# Patient Record
Sex: Male | Born: 1990 | Race: Black or African American | Hispanic: No | Marital: Single | Smoking: Current every day smoker
Health system: Southern US, Community
[De-identification: ages and names within clinical notes are randomized; demographics above are authoritative.]

## PROBLEM LIST (undated history)

## (undated) ENCOUNTER — Emergency Department (HOSPITAL_COMMUNITY): Admission: EM | Payer: Self-pay | Source: Home / Self Care

## (undated) DIAGNOSIS — F209 Schizophrenia, unspecified: Secondary | ICD-10-CM

## (undated) DIAGNOSIS — F909 Attention-deficit hyperactivity disorder, unspecified type: Secondary | ICD-10-CM

## (undated) DIAGNOSIS — R4689 Other symptoms and signs involving appearance and behavior: Secondary | ICD-10-CM

## (undated) DIAGNOSIS — F3130 Bipolar disorder, current episode depressed, mild or moderate severity, unspecified: Secondary | ICD-10-CM

## (undated) DIAGNOSIS — J45909 Unspecified asthma, uncomplicated: Secondary | ICD-10-CM

## (undated) DIAGNOSIS — F32A Depression, unspecified: Secondary | ICD-10-CM

## (undated) DIAGNOSIS — F603 Borderline personality disorder: Secondary | ICD-10-CM

## (undated) DIAGNOSIS — F200 Paranoid schizophrenia: Secondary | ICD-10-CM

## (undated) DIAGNOSIS — R4589 Other symptoms and signs involving emotional state: Secondary | ICD-10-CM

## (undated) DIAGNOSIS — F319 Bipolar disorder, unspecified: Secondary | ICD-10-CM

## (undated) DIAGNOSIS — R569 Unspecified convulsions: Secondary | ICD-10-CM

## (undated) DIAGNOSIS — F329 Major depressive disorder, single episode, unspecified: Secondary | ICD-10-CM

## (undated) DIAGNOSIS — F4310 Post-traumatic stress disorder, unspecified: Secondary | ICD-10-CM

## (undated) DIAGNOSIS — F1910 Other psychoactive substance abuse, uncomplicated: Secondary | ICD-10-CM

## (undated) DIAGNOSIS — R55 Syncope and collapse: Secondary | ICD-10-CM

## (undated) HISTORY — PX: LEG SURGERY: SHX1003

---
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
## 2010-05-05 LAB — COMPREHENSIVE METABOLIC PANEL
ALT: 19 U/L (ref 0–53)
AST: 22 U/L (ref 0–37)
Alkaline Phosphatase: 97 U/L (ref 39–117)
CO2: 26 mEq/L (ref 19–32)
Chloride: 108 mEq/L (ref 96–112)
Creatinine, Ser: 0.97 mg/dL (ref 0.4–1.5)
Total Bilirubin: 0.9 mg/dL (ref 0.3–1.2)
Total Protein: 7.1 g/dL (ref 6.0–8.3)

## 2010-05-05 LAB — RAPID URINE DRUG SCREEN, HOSP PERFORMED
Amphetamines: NOT DETECTED
Amphetamines: NOT DETECTED
Barbiturates: NOT DETECTED
Barbiturates: NOT DETECTED
Benzodiazepines: NOT DETECTED
Cocaine: NOT DETECTED
Cocaine: NOT DETECTED

## 2010-05-05 LAB — CARDIAC PANEL(CRET KIN+CKTOT+MB+TROPI)
CK, MB: 1.6 ng/mL (ref 0.3–4.0)
CK, MB: 1.6 ng/mL (ref 0.3–4.0)
Relative Index: 0.8 (ref 0.0–2.5)
Relative Index: 0.8 (ref 0.0–2.5)
Total CK: 207 U/L (ref 7–232)
Total CK: 212 U/L (ref 7–232)
Troponin I: 0.01 ng/mL (ref 0.00–0.06)

## 2010-05-05 LAB — DIFFERENTIAL
Basophils Absolute: 0 10*3/uL (ref 0.0–0.1)
Basophils Relative: 0 % (ref 0–1)
Basophils Relative: 1 % (ref 0–1)
Eosinophils Absolute: 0.1 10*3/uL (ref 0.0–0.7)
Eosinophils Absolute: 0.2 10*3/uL (ref 0.0–0.7)
Eosinophils Relative: 1 % (ref 0–5)
Eosinophils Relative: 2 % (ref 0–5)
Lymphs Abs: 1.4 10*3/uL (ref 0.7–4.0)
Monocytes Absolute: 0.6 10*3/uL (ref 0.1–1.0)
Monocytes Relative: 6 % (ref 3–12)
Neutro Abs: 8.5 10*3/uL — ABNORMAL HIGH (ref 1.7–7.7)
Neutrophils Relative %: 80 % — ABNORMAL HIGH (ref 43–77)

## 2010-05-05 LAB — BASIC METABOLIC PANEL
BUN: 8 mg/dL (ref 6–23)
CO2: 28 mEq/L (ref 19–32)
Calcium: 9.1 mg/dL (ref 8.4–10.5)
Chloride: 106 mEq/L (ref 96–112)
Creatinine, Ser: 0.84 mg/dL (ref 0.4–1.5)
GFR calc Af Amer: >60 mL/min (ref >60)
GFR calc non Af Amer: >60 mL/min (ref >60)
Glucose, Bld: 93 mg/dL (ref 70–99)
Potassium: 4.1 mEq/L (ref 3.5–5.1)
Sodium: 138 mEq/L (ref 135–145)

## 2010-05-05 LAB — CBC
HCT: 39.1 % (ref 39.0–52.0)
MCHC: 33.1 g/dL (ref 30.0–36.0)
MCHC: 33.4 g/dL (ref 30.0–36.0)
MCV: 95.5 fL (ref 78.0–100.0)
MCV: 96.3 fL (ref 78.0–100.0)
Platelets: 107 10*3/uL — ABNORMAL LOW (ref 150–400)
RBC: 4.33 MIL/uL (ref 4.22–5.81)
RDW: 14.6 % (ref 11.5–15.5)
RDW: 14.8 % (ref 11.5–15.5)
WBC: 7.8 10*3/uL (ref 4.0–10.5)

## 2010-05-05 LAB — CK TOTAL AND CKMB (NOT AT ARMC): Total CK: 299 U/L — ABNORMAL HIGH (ref 7–232)

## 2010-05-05 LAB — POCT I-STAT, CHEM 8
BUN: 13 mg/dL (ref 6–23)
Calcium, Ion: 1.21 mmol/L (ref 1.12–1.32)
Creatinine, Ser: 1 mg/dL (ref 0.4–1.5)
Glucose, Bld: 79 mg/dL (ref 70–99)
Hemoglobin: 16 g/dL (ref 13.0–17.0)

## 2010-05-05 LAB — ETHANOL: Alcohol, Ethyl (B): 5 mg/dL (ref 0–10)

## 2010-05-05 LAB — ACETAMINOPHEN LEVEL: Acetaminophen (Tylenol), Serum: <10 ug/mL — ABNORMAL LOW (ref 10–30)

## 2010-05-08 LAB — CBC
MCV: 94.5 fL (ref 78.0–100.0)
Platelets: 161 10*3/uL (ref 150–400)
RBC: 4.5 MIL/uL (ref 4.22–5.81)
WBC: 8 10*3/uL (ref 4.0–10.5)

## 2010-05-08 LAB — DIFFERENTIAL
Basophils Relative: 0 % (ref 0–1)
Eosinophils Absolute: 0.2 10*3/uL (ref 0.0–0.7)
Lymphs Abs: 1.4 10*3/uL (ref 0.7–4.0)
Neutro Abs: 5.7 10*3/uL (ref 1.7–7.7)
Neutrophils Relative %: 71 % (ref 43–77)

## 2010-05-08 LAB — BASIC METABOLIC PANEL
BUN: 6 mg/dL (ref 6–23)
Calcium: 9.6 mg/dL (ref 8.4–10.5)
Chloride: 103 mEq/L (ref 96–112)
Creatinine, Ser: 0.94 mg/dL (ref 0.4–1.5)
GFR calc Af Amer: >60 mL/min (ref >60)

## 2010-05-08 LAB — ETHANOL: Alcohol, Ethyl (B): <5 mg/dL (ref 0–10)

## 2010-05-12 LAB — BASIC METABOLIC PANEL
CO2: 27 mEq/L (ref 19–32)
Calcium: 8.9 mg/dL (ref 8.4–10.5)
Chloride: 108 mEq/L (ref 96–112)
Creatinine, Ser: 0.85 mg/dL (ref 0.4–1.5)
Glucose, Bld: 106 mg/dL — ABNORMAL HIGH (ref 70–99)

## 2010-05-12 LAB — DIFFERENTIAL
Basophils Relative: 6 % — ABNORMAL HIGH (ref 0–1)
Eosinophils Absolute: 0.3 10*3/uL (ref 0.0–0.7)
Lymphs Abs: 3.6 10*3/uL (ref 0.7–4.0)
Monocytes Absolute: 1.2 10*3/uL — ABNORMAL HIGH (ref 0.1–1.0)
Neutrophils Relative %: 60 % (ref 43–77)

## 2010-05-12 LAB — RAPID URINE DRUG SCREEN, HOSP PERFORMED
Amphetamines: NOT DETECTED
Amphetamines: NOT DETECTED
Barbiturates: NOT DETECTED
Barbiturates: NOT DETECTED
Benzodiazepines: NOT DETECTED
Benzodiazepines: NOT DETECTED
Cocaine: NOT DETECTED
Opiates: NOT DETECTED
Opiates: NOT DETECTED
Tetrahydrocannabinol: POSITIVE — AB

## 2010-05-12 LAB — POCT I-STAT, CHEM 8
BUN: 13 mg/dL (ref 6–23)
Chloride: 105 mEq/L (ref 96–112)
HCT: 42 % (ref 39.0–52.0)
Potassium: 4 mEq/L (ref 3.5–5.1)

## 2010-05-12 LAB — HEPATIC FUNCTION PANEL
AST: 28 U/L (ref 0–37)
Albumin: 3.2 g/dL — ABNORMAL LOW (ref 3.5–5.2)
Total Protein: 6.4 g/dL (ref 6.0–8.3)

## 2010-05-12 LAB — CBC
MCHC: 33.5 g/dL (ref 30.0–36.0)
MCV: 93.8 fL (ref 78.0–100.0)
RDW: 13.6 % (ref 11.5–15.5)

## 2010-05-21 LAB — COMPREHENSIVE METABOLIC PANEL
ALT: 43 U/L (ref 0–53)
Albumin: 4.1 g/dL (ref 3.5–5.2)
Alkaline Phosphatase: 116 U/L (ref 52–171)
Calcium: 9.4 mg/dL (ref 8.4–10.5)
Glucose, Bld: 113 mg/dL — ABNORMAL HIGH (ref 70–99)
Potassium: 3.6 mEq/L (ref 3.5–5.1)
Sodium: 139 mEq/L (ref 135–145)
Total Protein: 7.4 g/dL (ref 6.0–8.3)

## 2010-05-21 LAB — HEPATIC FUNCTION PANEL
Albumin: 3.6 g/dL (ref 3.5–5.2)
Alkaline Phosphatase: 93 U/L (ref 52–171)
Bilirubin, Direct: <0.1 mg/dL (ref 0.0–0.3)
Total Bilirubin: 0.7 mg/dL (ref 0.3–1.2)

## 2010-05-21 LAB — ETHANOL: Alcohol, Ethyl (B): <5 mg/dL (ref 0–10)

## 2010-05-21 LAB — RAPID URINE DRUG SCREEN, HOSP PERFORMED
Barbiturates: NOT DETECTED
Cocaine: NOT DETECTED
Opiates: NOT DETECTED
Tetrahydrocannabinol: NOT DETECTED

## 2010-05-21 LAB — URINALYSIS, MICROSCOPIC ONLY
Bilirubin Urine: NEGATIVE
Glucose, UA: NEGATIVE mg/dL
Ketones, ur: NEGATIVE mg/dL
Protein, ur: NEGATIVE mg/dL
pH: 7 (ref 5.0–8.0)

## 2010-05-21 LAB — DIFFERENTIAL
Basophils Relative: 1 % (ref 0–1)
Eosinophils Absolute: 0.1 10*3/uL (ref 0.0–1.2)
Monocytes Relative: 5 % (ref 3–11)
Neutro Abs: 10.3 10*3/uL — ABNORMAL HIGH (ref 1.7–8.0)
Neutrophils Relative %: 79 % — ABNORMAL HIGH (ref 43–71)

## 2010-05-21 LAB — CBC
Hemoglobin: 13.5 g/dL (ref 12.0–16.0)
MCHC: 32.5 g/dL (ref 31.0–37.0)
Platelets: 141 10*3/uL — ABNORMAL LOW (ref 150–400)
RDW: 14.5 % (ref 11.4–15.5)

## 2010-05-21 LAB — T4, FREE: Free T4: 1.07 ng/dL (ref 0.80–1.80)

## 2010-05-21 LAB — GC/CHLAMYDIA PROBE AMP, URINE: Chlamydia, Swab/Urine, PCR: NEGATIVE

## 2010-05-21 LAB — RPR: RPR Ser Ql: NONREACTIVE

## 2010-05-21 LAB — GAMMA GT: GGT: 20 U/L (ref 7–51)

## 2010-06-03 ENCOUNTER — Emergency Department (HOSPITAL_COMMUNITY)
Admission: EM | Admit: 2010-06-03 | Discharge: 2010-06-06 | Disposition: A | Discharge status: Home / Self Care | Payer: Self-pay | Attending: Emergency Medicine | Admitting: Emergency Medicine

## 2010-06-03 ENCOUNTER — Ambulatory Visit (HOSPITAL_COMMUNITY)
Admission: RE | Admit: 2010-06-03 | Discharge: 2010-06-03 | Disposition: A | Discharge status: Home / Self Care | Payer: Self-pay | Attending: Psychiatry | Admitting: Psychiatry

## 2010-06-03 DIAGNOSIS — J45909 Unspecified asthma, uncomplicated: Secondary | ICD-10-CM | POA: Insufficient documentation

## 2010-06-03 DIAGNOSIS — F332 Major depressive disorder, recurrent severe without psychotic features: Secondary | ICD-10-CM | POA: Insufficient documentation

## 2010-06-03 DIAGNOSIS — Z79899 Other long term (current) drug therapy: Secondary | ICD-10-CM | POA: Insufficient documentation

## 2010-06-03 DIAGNOSIS — F3289 Other specified depressive episodes: Secondary | ICD-10-CM | POA: Insufficient documentation

## 2010-06-03 DIAGNOSIS — F329 Major depressive disorder, single episode, unspecified: Secondary | ICD-10-CM | POA: Insufficient documentation

## 2010-06-03 LAB — COMPREHENSIVE METABOLIC PANEL
AST: 22 U/L (ref 0–37)
Albumin: 3.8 g/dL (ref 3.5–5.2)
Alkaline Phosphatase: 84 U/L (ref 39–117)
BUN: 7 mg/dL (ref 6–23)
Chloride: 103 mEq/L (ref 96–112)
GFR calc Af Amer: >60 mL/min (ref >60)
Potassium: 3.9 mEq/L (ref 3.5–5.1)
Total Protein: 7.2 g/dL (ref 6.0–8.3)

## 2010-06-03 LAB — CBC
Hemoglobin: 12.9 g/dL — ABNORMAL LOW (ref 13.0–17.0)
MCH: 31 pg (ref 26.0–34.0)
Platelets: 145 10*3/uL — ABNORMAL LOW (ref 150–400)
RBC: 4.16 MIL/uL — ABNORMAL LOW (ref 4.22–5.81)
WBC: 10.2 10*3/uL (ref 4.0–10.5)

## 2010-06-03 LAB — RAPID URINE DRUG SCREEN, HOSP PERFORMED
Amphetamines: NOT DETECTED
Benzodiazepines: NOT DETECTED
Cocaine: NOT DETECTED
Tetrahydrocannabinol: NOT DETECTED

## 2010-06-03 LAB — DIFFERENTIAL
Basophils Absolute: 0 10*3/uL (ref 0.0–0.1)
Basophils Relative: 0 % (ref 0–1)
Eosinophils Absolute: 0.3 10*3/uL (ref 0.0–0.7)
Monocytes Relative: 7 % (ref 3–12)
Neutro Abs: 7.1 10*3/uL (ref 1.7–7.7)
Neutrophils Relative %: 69 % (ref 43–77)

## 2010-06-03 LAB — SALICYLATE LEVEL: Salicylate Lvl: <4 mg/dL (ref 2.8–20.0)

## 2010-06-03 LAB — ACETAMINOPHEN LEVEL: Acetaminophen (Tylenol), Serum: <10 ug/mL — ABNORMAL LOW (ref 10–30)

## 2010-06-03 LAB — TRICYCLICS SCREEN, URINE: TCA Scrn: NOT DETECTED

## 2010-06-04 DIAGNOSIS — F329 Major depressive disorder, single episode, unspecified: Secondary | ICD-10-CM

## 2010-06-04 NOTE — Consult Note (Signed)
  NAME:  [name] [name], [name] [name] NO.:  [id number]  MEDICAL RECORD NO.:  [id number]           PATIENT TYPE:  E  LOCATION:  WLED                         FACILITY:  [facility]  PHYSICIAN:  Eulogio Ditch, MD DATE OF BIRTH:  08/28/1990  DATE OF CONSULTATION:  06/04/2010 DATE OF DISCHARGE:                                CONSULTATION   REASON FOR CONSULTATION:  Depression and suicidal ideation.  HISTORY OF PRESENT ILLNESS:  I saw the patient and reviewed the medical records.  20 year old [place] American male with history of depression who was in jail for 2 years for larceny in [place] and discharged on April 26, 2010.  After that he went to [facility], from where he discharged on 05/30/2010.  After discharge he started drinking alcohol and using drugs.  He also reported that people are not helping him that is why he became depressed and tried to hurt himself.  He has a plan to hang himself.  Denies hearing any voices.  He is logical and goal directed, not internally preoccupied.  The patient has multiple suicide attempts in the past.  History of alcohol and Xanax abuse since the age of 69.  MEDICAL HISTORY:  History of asthma.  ALLERGIES:  NEURONTIN and EFFEXOR.  MENTAL STATUS EXAM:  Calm, cooperative during the interview.  Fair eye contact.  No psychomotor agitation or retardation noted during the interview.  No abnormal movements present.  Mood is depressed.  Affect is mood congruent.  Thought process is logical and goal directed. Thought content:  Suicidal ideations present with a plan to hang himself, not delusional.  Thought perception:  No audiovisual hallucination reported, not internally preoccupied.  Cognition:  Alert, awake, oriented x3.  Memory:  Immediate, recent, remote fair.  Attention and concentration good.  Abstraction ability is fair.  Insight and judgment are poor.  DIAGNOSES:  AXIS I: 1. Polysubstance abuse. 2. Depressive disorder, not otherwise  specified. AXIS II:  Deferred. AXIS III:  Asthma. AXIS IV:  Chronic drug abuse. AXIS V:  Global assessment of functioning, 30 to 40.  RECOMMENDATIONS: 1. The patient will be referred back to state hospital, [facility]. 2. The patient will be continued on Wellbutrin 150 mg p.o. daily,     Depakote ER 500 mg at bedtime, Haldol 5 mg twice a day, Trazodone     50 mg at bedtime.     Eulogio Ditch, MD     SA/MEDQ  D:  06/04/2010  T:  06/04/2010  Job:  [id number]  Electronically Signed by Eulogio Ditch  on 06/04/2010 06:55:35 PM

## 2010-06-05 DIAGNOSIS — F329 Major depressive disorder, single episode, unspecified: Secondary | ICD-10-CM

## 2010-06-06 DIAGNOSIS — F329 Major depressive disorder, single episode, unspecified: Secondary | ICD-10-CM

## 2010-06-07 ENCOUNTER — Emergency Department (HOSPITAL_COMMUNITY): Payer: Self-pay

## 2010-06-07 ENCOUNTER — Emergency Department (HOSPITAL_COMMUNITY)
Admission: EM | Admit: 2010-06-07 | Discharge: 2010-06-08 | Disposition: A | Discharge status: Home / Self Care | Payer: Self-pay | Attending: Emergency Medicine | Admitting: Emergency Medicine

## 2010-06-07 DIAGNOSIS — R279 Unspecified lack of coordination: Secondary | ICD-10-CM | POA: Insufficient documentation

## 2010-06-07 DIAGNOSIS — F1910 Other psychoactive substance abuse, uncomplicated: Secondary | ICD-10-CM | POA: Insufficient documentation

## 2010-06-07 DIAGNOSIS — F411 Generalized anxiety disorder: Secondary | ICD-10-CM | POA: Insufficient documentation

## 2010-06-07 DIAGNOSIS — R4182 Altered mental status, unspecified: Secondary | ICD-10-CM | POA: Insufficient documentation

## 2010-06-07 DIAGNOSIS — R7309 Other abnormal glucose: Secondary | ICD-10-CM | POA: Insufficient documentation

## 2010-06-07 DIAGNOSIS — F1010 Alcohol abuse, uncomplicated: Secondary | ICD-10-CM | POA: Insufficient documentation

## 2010-06-07 LAB — POCT I-STAT, CHEM 8
Calcium, Ion: 1.09 mmol/L — ABNORMAL LOW (ref 1.12–1.32)
Chloride: 105 mEq/L (ref 96–112)
HCT: 41 % (ref 39.0–52.0)
Sodium: 142 mEq/L (ref 135–145)
TCO2: 23 mmol/L (ref 0–100)

## 2010-06-07 LAB — GLUCOSE, CAPILLARY

## 2010-06-08 NOTE — Discharge Summary (Signed)
  NAME:  [name] [name], [name] [name] NO.:  [id number]  MEDICAL RECORD NO.:  [id number]           PATIENT TYPE:  E  LOCATION:  WLED                         FACILITY:  [facility]  PHYSICIAN:  Eulogio Ditch, MD DATE OF BIRTH:  [birth year]  DATE OF ADMISSION:  06/03/2010 DATE OF DISCHARGE:                              DISCHARGE SUMMARY   HOSPITAL COURSE IN THE EMERGENCY ROOM:  Patient is doing very well in the ER since he is here for the last few days.  Since Tuesday, patient is denying any suicidal or homicidal ideations.  Patient is very logical and goal directed to the interview.  Denies hearing any voices.  Patient wants to go home.  Patient was seen with the nursing staff and the case manager.  Patient at this time wants to go back with the mother.  We called the mother and she agreed with this disposition at this time and she has no safety concern at this time.  As patient is not having any acute psychotic or manic symptoms, is not actively suicidal, patient can be discharged home to follow up in the outpatient setting.  DISCHARGE MEDICATIONS:  Patient can be continued on: 1. Wellbutrin 150 mg daily. 2. Depakote 500 mg at bedtime. 3. Haldol 5 mg p.o. b.i.d. 4. Trazodone 50 mg at bedtime.  Case manager will give the outpatient followup appointment to the patient.     Eulogio Ditch, MD     SA/MEDQ  D:  06/06/2010  T:  06/06/2010  Job:  [id number]  Electronically Signed by Eulogio Ditch  on 06/08/2010 05:45:12 AM
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
## 2010-07-05 NOTE — H&P (Signed)
NAME:  OTIS, PORTAL NO.:  [id number]   MEDICAL RECORD NO.:  [id number]          PATIENT TYPE:  INP   LOCATION:  0200                          FACILITY:  [facility]   PHYSICIAN:  Lalla Brothers, MDDATE OF BIRTH:  01-27-1991   DATE OF ADMISSION:  07/08/2004  DATE OF DISCHARGE:                         PSYCHIATRIC ADMISSION ASSESSMENT   INTRODUCTION:  [name] [name] is a 20 year old male.   CHIEF COMPLAINT:  [name] [name] was admitted to the hospital after making scratches  on his wrist and turning himself in to [facility] on his own  without his mother's accompaniment saying that he felt suicidal and he  needed protection from himself.   HISTORY OF PRESENT ILLNESS:  [name] [name] says the problem is that he has a court  date for June 8th for not going to school.  He says he did not go to school  because he already knows what the teachers are teaching.  He said that he  had attended without missing days until February, then he missed the whole  month of February, he missed 4-5 days in March, he missed some days in April  and apparently he has missed some days in May.  He was very vague about the  reasons and did not really give a reason other than that he knew everything  already.   FAMILY/SCHOOL/SOCIAL ISSUES:  [name] [name] says he is in the seventh grade.  His  grades, he says, are A's and B's when he goes to school and does his work.  He says he has no friends at school because he does not want friends at  school.  He does not go to school for friends, he said.  He said he does not  talk at school.  He does his work and that is it.  He said he has friends  outside his school but he was also very vague about what kind of friends he  has and what kinds of things he does.  When asked how he spends his time, he  says he spends his time doing things.  He was not more specific about  that.  He said he lived with his mother and her friend who is a male.  Her  friend has been there  for a month.  He says he does not like her because she  talks too much.  He likes his mother and he likes his 3 year old sister.  He says they get along with each other except for the male friend.  He  says his father lives in town.  He has not seen him for the last five or six  years.  His father does not pay child support.  He did not indicate whether  he cared one way or the other about his father or the relationship when he  was asked.  He says his mother did not know he was in the hospital and, when  asked what she might be think when she finds out he has not been home all  night, he said she would just assume that he was wandering the street  because  that is what he normally does and, when he comes home, she just asks  him where he has been, he says wandering the streets and she says as long as  you are home, according to his report.  According to the reports  accompanying him, he has a history of oppositional behavior with fighting  and stealing and lying as well as not going to school.  He did not mention  any of those issues to me.  He denied any abuse, physically or sexually.  He  says he does have friends outside of school but he was very nonspecific  about what kind of friends or what they might be doing together.   PREVIOUS PSYCHIATRIC TREATMENT:  He was an inpatient here in April of 2004.  He said he has not followed up with any outpatient treatment.   MEDICAL PROBLEMS/ALLERGIES/MEDICATIONS:  He has a history of asthma.  He is  not taking any medications currently.  He has no known allergies.   DRUG/ALCOHOL/LEGAL ISSUES:  He says he occasionally uses pot, maybe once  every three or four months but he used it two weeks ago.   MENTAL STATUS EXAM:  Mental status, at the time of the initial evaluation,  revealed an alert, oriented boy who came to the interview willingly and was  cooperative.  He was appropriately dressed and groomed.  He admitted to  suicidal thoughts and  making scratches on his arm, saying that last night  that he might cut deeper and actually try to kill himself and that is why he  brought himself to the hospital.  He says he has been making cuts and  scratches on himself for at least the last year.  He also cuts on his back.  He says he reaches back with a knife and cuts whenever people make him mad.  Most of the cuts and scratches apparently come when people make him mad and  not when he is suicidal.  He says he does feel depressed, however.  The main  focus of his depression was the apparent court date coming up.  Without  that, he seems to have been fairly happy living his life, not going to  school and on the streets, according to him.  There was no evidence of any  thought disorder or other psychosis.  He made good eye contact.  He seemed  to be uninterested in talking about things and basically answered my  questions as simply as possible and in a very vague fashion.  Long and short-  term memory seemed to be intact as measured by his ability to recall recent  and remote events in his own life.  His judgment currently seemed somewhat  impaired by his general attitude.  Insight was lacking.  Intellectual  functioning seems at least average.  Concentration was adequate for a one-to-  one interview.   ASSETS:  [name] [name] is cooperative.   ADMISSION DIAGNOSES:   AXIS I:  1.  Depressive disorder not otherwise specified.  2.  Oppositional defiant disorder.  3.  Attention-deficit disorder by history.   AXIS II:  Deferred.   AXIS III:  Asthma by history.   AXIS IV:  Moderate.   AXIS V:  50/60.   ESTIMATED LENGTH OF STAY:  Five to six days.   PLAN:  To stabilize to the point of having no suicidal ideation and  hopefully he will be taking some responsibility for his own behavior by that  time.  Dr.  Marlyne Beards will be the attending.     GT/MEDQ  D:  07/08/2004  T:  07/08/2004  Job:  [id number]
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
## 2010-07-05 NOTE — Discharge Summary (Signed)
NAME:  [name] [name], GRANDFIELD NO.:  [id number]   MEDICAL RECORD NO.:  [id number]          PATIENT TYPE:  INP   LOCATION:  0200                          FACILITY:  BH   PHYSICIAN:  Lalla Brothers, MDDATE OF BIRTH:  11-06-1990   DATE OF ADMISSION:  07/08/2004  DATE OF DISCHARGE:  07/15/2004                                 DISCHARGE SUMMARY   IDENTIFICATION:  This 52-1/20-year-old male, seventh grade student at  [employer], was admitted emergently involuntarily on a  [facility] petition for commitment in transfer from [facility]  Emergency Department, where the patient was sent from his walk-in status at  the [facility], being a minor and reporting plans to cut his  wrist. Mother could not be reached in any way including sending the police  to her house and did not reply to any of potential phone contacts. The  patient lives with mother and mother's girlfriend and sister. He appears to  have legal charges that extend beyond truancy so that his DSS worker  concludes that the patient came to the hospital to avoid going to jail. The  patient seems to follow a similar pattern relative to behavioral change for  defiance of responsibility and relational needs. For full details, please  see the typed admission assessment by Dr. Carolanne Grumbling.   SYNOPSIS OF PRESENT ILLNESS:  The patient had made scratches on his wrist  and reported feeling suicidal and needing protection from himself. He has  court July 25, 2004 for not attending school which he initially states is due  to having untreated ADHD so that he does not remember. He apparently missed  school all of February and much of March and his pattern continued into  April and May. He suggests he can make A's and B's when he goes to school  and does his work. He suggests that his memory is poor and that he is hyper,  impulsive and inattentive. Mother suggests that the patient is taking  Strattera, stimulants and other medications and she disapproves of any  medication except something to help him sleep. Mother could not be reached  for such information for a number of days. The patient has significant  oppositionality. He lies and steals as well as fights. He gradually  indicates that he is in the [employer] gang. He reports cutting with his knife  anytime people make him mad and suggests that upcoming court is most  depressing currently. He was treated with antidepressant in 2004 when  hospitalized at the [facility] and treated with Wellbutrin at  that time, having no side effects. He had asthma noted during that  hospitalization. He was diagnosed with major depression, ADHD and conduct  disorder by Dr. Haynes Hoehn. Father has been out of the patient's life for  seven years and, when involved, is a negative influence. Father is suicidal  at times and has symptoms similar to the patient. The patient has used  ecstasy, cannabis and wine. He has seen Remi Deter at [facility] [place] for therapy in the past. He  has apparently been hospitalized  at [facility] in the fall of 2001 and [facility] in 2002.   INITIAL MENTAL STATUS EXAM:  Dr. Ladona Ridgel noted the patient acknowledged  suicide thoughts, thinking of cutting deeper. He noted self-cutting over the  last year when people make him mad. He denies depression except about  upcoming court. No organicity was evident. He has a history of ADHD. His  oppositional attitude and devaluation of learning impairs judgment even  further.   LABORATORY FINDINGS:  Hemoglobin was 14.6 and hematocrit 43 in the emergency  room with venous pH 7.327 slightly elevated with bicarbonate 24.4, slightly  elevated. Basic metabolic panel noted sodium normal at 140, potassium 4.3,  random glucose of 80 and BUN 11. Hepatic function panel was normal at the  [facility] with albumin 4.1, AST 26, ALT  24 and GGT 17. TSH  was normal at 0.700. Urine drug screen was negative in the emergency room  with alcohol negative at less than 5 mg/dL. Urinalysis was normal with  specific gravity of 1.013. RPR was nonreactive.   HOSPITAL COURSE AND TREATMENT:  General medical exam in the emergency room  by Dr. Cheri Guppy and staff provided medical clearance with no  abnormalities noted other than superficial abrasions, self-inflicted.  Admission height was 66 inches with weight of 125 pounds, incorrectly  recorded, with discharge subsequent weight 143 pounds and final weight on  the day of discharge 141 pounds. Vital signs were normal throughout hospital  stay and admission blood pressure 125/74 and heart rate 61 (sitting) and  123/82 with heart rate of 61 (standing). At the time of discharge, supine  blood pressure was 116/71 with heart rate of 59 and standing blood pressure  115/74 with heart rate of 107. The patient received no medication during  hospital stay except a Nicoderm patch for nicotine withdrawal and Dulcolax  tablet for constipation with the Nicoderm patch being 7 mg. Mother  specifically declined Strattera though she would approve of a sleeping pill  for the patient. She then refused Geodon or Seroquel, indicating that she  thought such medications would damaged the patient's kidneys. The patient  maintained a superficial relationship with staff through the hospital stay,  seeking nurturing as a displacement for responsibility for his actions.  Support and containment were simultaneously established so that behavioral  change and anger management would be possible while also working upon his  accountability and safety in his behavior. A case conference and treatment  staffing were carried out Jul 12, 2004 with the patient's [facility] social worker  present as well as mother and her girlfriend. They clarify for the patient that there were certain expectations he succeed with to avoid  [facility] placement or group home placement. The patient concluded that he had  three days to decide how to make the most of his daily life according to  this meeting. The patient by the time of discharge, three days later, had  written on his wall with cut-out letters hate ppl and had placed drops of  blood on the letters pasted on the wall as well as on his sheet. He obtained  his blood by using his plastic comb to cause an abrasion on his right ulnar  wrist. The patient indicated that he concluded by such that he intends to  continue to be a part of the [employer] gang and their associated activities. He  did work on capacity to participate in jail time safely  and effectively  during his hospital stay. Mother indicated that she would just provide  direction for the patient as possible and did not seek furthermore  consolidated treatment. In fact, mother devalued treatment stating that  medications are just to harm children. The patient did not require seclusion  or restraint during the hospital stay.   FINAL DIAGNOSES:   AXIS I:  1. Dysthymic disorder, early onset, moderate severity with atypical      features.  2. Attention-deficit hyperactivity disorder, combined-type, moderate to      severe.  3. Conduct disorder, adolescent onset.  4. Parent-child problem.  5. Other specified family circumstances.  6. Other interpersonal problem.  7. Noncompliance with treatment.  8. Psychoactive substance abuse not otherwise specified.     AXIS II:  No diagnosis.   AXIS III:  1. Allergic rhinitis and asthma.  2. Family history of sickle cell trait.  3. Family history of lupus.     AXIS IV:  Stressors:  Family--severe to extreme, acute and chronic; phase of  life--severe, acute and chronic; school--severe, acute and chronic.   AXIS V:  Global Assessment of Functioning on admission 40; highest in last  year 60; discharge Global Assessment of Functioning 48.   CONDITION ON  DISCHARGE:  The patient was discharged to mother in improved  condition. Wound care instructions were given for the right wrist abrasion,  to keep clean until healed. Other wounds from admission were essentially  healed.   DISCHARGE MEDICATIONS:  The patient was discharged on no medication with his  Nicoderm patch removed.   Constipation does not appear clinically objectively significant though the  patient's complaint of no stool in two weeks was treated with a Dulcolax  tablet. Verification of the patient treatment needs were provided though  these largely appear organized around conduct disorder and primarily  behavior modification. He is encouraged to request  maximum penalty for any further delinquent behavior. He goes to court July 25, 2004 in this regard. He acknowledges that he is behaviorally prepared to  cope with jail if placed there as of the time of discharge. Crisis and safety plans are outlined if needed. He follows a regular diet and has no  restrictions on physical activity.      GEJ/MEDQ  D:  07/15/2004  T:  07/15/2004  Job:  [id number]   cc:   Alfonzo Feller of [facility]  [address]  [place], [place] [address]

## 2010-07-05 NOTE — Op Note (Signed)
   NAME:  [name] [name], [name] [name] NO.:  [id number]   MEDICAL RECORD NO.:  [id number]                   PATIENT TYPE:  AMB   LOCATION:  DSC                                  FACILITY:  [facility]   PHYSICIAN:  Prabhakar D. Pendse, M.D.           DATE OF BIRTH:  [birth year]   DATE OF PROCEDURE:  10/26/2001  DATE OF DISCHARGE:                                 OPERATIVE REPORT   PREOPERATIVE DIAGNOSIS:  Multiple (three) complete abscesses of chest wall.   POSTOPERATIVE DIAGNOSIS:  Multiple (three) abscesses of chest wall.   PROCEDURE:  Irrigation and drainage of multiple (three) abscess of chest  wall.   SURGEON:  Prabhakar D. Levie Heritage, M.D.   ASSISTANT:  Nurse.   ANESTHESIA:  Nurse.   DESCRIPTION OF PROCEDURE:  Under satisfactory general anesthesia, the  patient in the supine position, anterior neck and chest regions were  sterilely prepped and draped in the usual manner.  By means of 11 blade  scalpel, all three abscesses were opened with horizontal incisions.  Skin  and subcutaneous tissues were incised.  Blunt and sharp dissection was  carried out to drain all the abscesses and debridement was carried out.  The  wound was reirrigated and all of the abscess cavities were packed with  Iodoform gauze of 0.25 inch with appropriate dressing applied.  Throughout  the procedure, the patient's vital signs remained stable.  The patient  withstood the procedure well and was transferred to the recovery room in  satisfactory general condition.                                                Prabhakar D. Levie Heritage, M.D.    PDP/MEDQ  D:  10/26/2001  T:  10/27/2001  Job:  [id number]   cc:   [facility]  [facility]
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
## 2010-09-01 ENCOUNTER — Emergency Department (HOSPITAL_COMMUNITY): Payer: Self-pay

## 2010-09-01 ENCOUNTER — Emergency Department (HOSPITAL_COMMUNITY)
Admission: EM | Admit: 2010-09-01 | Discharge: 2010-09-12 | Disposition: A | Discharge status: Home / Self Care | Payer: Self-pay | Attending: Emergency Medicine | Admitting: Emergency Medicine

## 2010-09-01 DIAGNOSIS — F3289 Other specified depressive episodes: Secondary | ICD-10-CM | POA: Insufficient documentation

## 2010-09-01 DIAGNOSIS — S41109A Unspecified open wound of unspecified upper arm, initial encounter: Secondary | ICD-10-CM | POA: Insufficient documentation

## 2010-09-01 DIAGNOSIS — F329 Major depressive disorder, single episode, unspecified: Secondary | ICD-10-CM | POA: Insufficient documentation

## 2010-09-01 DIAGNOSIS — R45851 Suicidal ideations: Secondary | ICD-10-CM | POA: Insufficient documentation

## 2010-09-01 DIAGNOSIS — S61409A Unspecified open wound of unspecified hand, initial encounter: Secondary | ICD-10-CM | POA: Insufficient documentation

## 2010-09-01 DIAGNOSIS — X789XXA Intentional self-harm by unspecified sharp object, initial encounter: Secondary | ICD-10-CM | POA: Insufficient documentation

## 2010-09-01 LAB — DIFFERENTIAL
Basophils Relative: 0 % (ref 0–1)
Eosinophils Absolute: 0.1 10*3/uL (ref 0.0–0.7)
Neutro Abs: 10.4 10*3/uL — ABNORMAL HIGH (ref 1.7–7.7)
Neutrophils Relative %: 72 % (ref 43–77)

## 2010-09-01 LAB — COMPREHENSIVE METABOLIC PANEL
ALT: 23 U/L (ref 0–53)
AST: 40 U/L — ABNORMAL HIGH (ref 0–37)
Alkaline Phosphatase: 92 U/L (ref 39–117)
CO2: 23 mEq/L (ref 19–32)
Chloride: 104 mEq/L (ref 96–112)
GFR calc non Af Amer: >60 mL/min (ref >60)
Potassium: 4.2 mEq/L (ref 3.5–5.1)
Sodium: 137 mEq/L (ref 135–145)
Total Bilirubin: 0.3 mg/dL (ref 0.3–1.2)

## 2010-09-01 LAB — CBC
Platelets: 137 10*3/uL — ABNORMAL LOW (ref 150–400)
RBC: 4.44 MIL/uL (ref 4.22–5.81)
WBC: 14.5 10*3/uL — ABNORMAL HIGH (ref 4.0–10.5)

## 2010-09-01 LAB — RAPID URINE DRUG SCREEN, HOSP PERFORMED
Barbiturates: NOT DETECTED
Cocaine: NOT DETECTED
Tetrahydrocannabinol: NOT DETECTED

## 2010-10-05 ENCOUNTER — Emergency Department (HOSPITAL_COMMUNITY)
Admission: EM | Admit: 2010-10-05 | Discharge: 2010-10-05 | Disposition: A | Discharge status: Home / Self Care | Payer: Self-pay | Attending: Emergency Medicine | Admitting: Emergency Medicine

## 2010-10-05 ENCOUNTER — Emergency Department (HOSPITAL_COMMUNITY): Payer: Self-pay

## 2010-10-05 DIAGNOSIS — F329 Major depressive disorder, single episode, unspecified: Secondary | ICD-10-CM | POA: Insufficient documentation

## 2010-10-05 DIAGNOSIS — R45851 Suicidal ideations: Secondary | ICD-10-CM | POA: Insufficient documentation

## 2010-10-05 DIAGNOSIS — F259 Schizoaffective disorder, unspecified: Secondary | ICD-10-CM | POA: Insufficient documentation

## 2010-10-05 DIAGNOSIS — F909 Attention-deficit hyperactivity disorder, unspecified type: Secondary | ICD-10-CM | POA: Insufficient documentation

## 2010-10-05 DIAGNOSIS — F3289 Other specified depressive episodes: Secondary | ICD-10-CM | POA: Insufficient documentation

## 2010-10-05 DIAGNOSIS — J45909 Unspecified asthma, uncomplicated: Secondary | ICD-10-CM | POA: Insufficient documentation

## 2010-10-05 LAB — CBC
HCT: 38.2 % — ABNORMAL LOW (ref 39.0–52.0)
Platelets: 132 10*3/uL — ABNORMAL LOW (ref 150–400)
RDW: 13.6 % (ref 11.5–15.5)
WBC: 15.8 10*3/uL — ABNORMAL HIGH (ref 4.0–10.5)

## 2010-10-05 LAB — BASIC METABOLIC PANEL
BUN: 14 mg/dL (ref 6–23)
Calcium: 9.4 mg/dL (ref 8.4–10.5)
Creatinine, Ser: 1.02 mg/dL (ref 0.50–1.35)
GFR calc Af Amer: >60 mL/min (ref >60)
GFR calc non Af Amer: >60 mL/min (ref >60)
Glucose, Bld: 133 mg/dL — ABNORMAL HIGH (ref 70–99)
Potassium: 3.5 mEq/L (ref 3.5–5.1)

## 2010-10-05 LAB — DIFFERENTIAL
Basophils Absolute: 0 10*3/uL (ref 0.0–0.1)
Eosinophils Relative: 1 % (ref 0–5)
Lymphocytes Relative: 16 % (ref 12–46)

## 2010-10-05 LAB — RAPID URINE DRUG SCREEN, HOSP PERFORMED
Barbiturates: NOT DETECTED
Benzodiazepines: NOT DETECTED
Cocaine: NOT DETECTED

## 2010-10-09 ENCOUNTER — Emergency Department (HOSPITAL_COMMUNITY)
Admission: EM | Admit: 2010-10-09 | Discharge: 2010-10-09 | Disposition: A | Discharge status: Home / Self Care | Payer: Self-pay | Attending: Emergency Medicine | Admitting: Emergency Medicine

## 2010-10-09 ENCOUNTER — Emergency Department (HOSPITAL_COMMUNITY): Payer: Self-pay

## 2010-10-09 DIAGNOSIS — R569 Unspecified convulsions: Secondary | ICD-10-CM | POA: Insufficient documentation

## 2010-10-09 DIAGNOSIS — R109 Unspecified abdominal pain: Secondary | ICD-10-CM | POA: Insufficient documentation

## 2010-10-09 DIAGNOSIS — F259 Schizoaffective disorder, unspecified: Secondary | ICD-10-CM | POA: Insufficient documentation

## 2010-10-09 DIAGNOSIS — F909 Attention-deficit hyperactivity disorder, unspecified type: Secondary | ICD-10-CM | POA: Insufficient documentation

## 2010-10-09 DIAGNOSIS — J45909 Unspecified asthma, uncomplicated: Secondary | ICD-10-CM | POA: Insufficient documentation

## 2010-10-09 DIAGNOSIS — I4510 Unspecified right bundle-branch block: Secondary | ICD-10-CM | POA: Insufficient documentation

## 2010-10-09 LAB — CBC
HCT: 35.7 % — ABNORMAL LOW (ref 39.0–52.0)
Hemoglobin: 12.4 g/dL — ABNORMAL LOW (ref 13.0–17.0)
MCHC: 34.7 g/dL (ref 30.0–36.0)
MCV: 89.7 fL (ref 78.0–100.0)
RDW: 13.6 % (ref 11.5–15.5)

## 2010-10-09 LAB — COMPREHENSIVE METABOLIC PANEL
Albumin: 3.4 g/dL — ABNORMAL LOW (ref 3.5–5.2)
Alkaline Phosphatase: 88 U/L (ref 39–117)
BUN: 9 mg/dL (ref 6–23)
Creatinine, Ser: 0.95 mg/dL (ref 0.50–1.35)
GFR calc Af Amer: >60 mL/min (ref >60)
Glucose, Bld: 96 mg/dL (ref 70–99)
Potassium: 3.1 mEq/L — ABNORMAL LOW (ref 3.5–5.1)
Total Bilirubin: 0.2 mg/dL — ABNORMAL LOW (ref 0.3–1.2)
Total Protein: 6.4 g/dL (ref 6.0–8.3)

## 2010-10-09 LAB — LIPASE, BLOOD: Lipase: 18 U/L (ref 11–59)

## 2010-10-09 LAB — DIFFERENTIAL
Basophils Absolute: 0 10*3/uL (ref 0.0–0.1)
Basophils Relative: 0 % (ref 0–1)
Eosinophils Relative: 2 % (ref 0–5)
Lymphocytes Relative: 28 % (ref 12–46)
Lymphs Abs: 2.7 10*3/uL (ref 0.7–4.0)
Monocytes Absolute: 1 10*3/uL (ref 0.1–1.0)
Monocytes Relative: 11 % (ref 3–12)
Neutro Abs: 5.8 10*3/uL (ref 1.7–7.7)

## 2010-10-09 LAB — RAPID URINE DRUG SCREEN, HOSP PERFORMED
Amphetamines: NOT DETECTED
Benzodiazepines: POSITIVE — AB
Tetrahydrocannabinol: NOT DETECTED

## 2010-10-09 LAB — URINALYSIS, ROUTINE W REFLEX MICROSCOPIC
Glucose, UA: NEGATIVE mg/dL
Hgb urine dipstick: NEGATIVE
Ketones, ur: 15 mg/dL — AB
Protein, ur: NEGATIVE mg/dL
Urobilinogen, UA: 1 mg/dL (ref 0.0–1.0)

## 2010-10-09 LAB — ETHANOL: Alcohol, Ethyl (B): <11 mg/dL (ref 0–11)

## 2010-10-11 ENCOUNTER — Emergency Department (HOSPITAL_COMMUNITY)
Admission: EM | Admit: 2010-10-11 | Discharge: 2010-10-16 | Disposition: A | Discharge status: Other Acute Inpatient Hospital | Payer: Self-pay | Attending: Emergency Medicine | Admitting: Emergency Medicine

## 2010-10-11 DIAGNOSIS — I498 Other specified cardiac arrhythmias: Secondary | ICD-10-CM | POA: Insufficient documentation

## 2010-10-11 DIAGNOSIS — R4182 Altered mental status, unspecified: Secondary | ICD-10-CM | POA: Insufficient documentation

## 2010-10-11 DIAGNOSIS — F29 Unspecified psychosis not due to a substance or known physiological condition: Secondary | ICD-10-CM | POA: Insufficient documentation

## 2010-10-11 LAB — URINALYSIS, ROUTINE W REFLEX MICROSCOPIC
Leukocytes, UA: NEGATIVE
Nitrite: NEGATIVE
Protein, ur: NEGATIVE mg/dL
Specific Gravity, Urine: 1.019 (ref 1.005–1.030)
Urobilinogen, UA: 1 mg/dL (ref 0.0–1.0)

## 2010-10-11 LAB — COMPREHENSIVE METABOLIC PANEL
Alkaline Phosphatase: 84 U/L (ref 39–117)
BUN: 12 mg/dL (ref 6–23)
Creatinine, Ser: 0.91 mg/dL (ref 0.50–1.35)
GFR calc Af Amer: >60 mL/min (ref >60)
Glucose, Bld: 74 mg/dL (ref 70–99)
Potassium: 3.8 mEq/L (ref 3.5–5.1)
Total Bilirubin: 0.2 mg/dL — ABNORMAL LOW (ref 0.3–1.2)
Total Protein: 7.3 g/dL (ref 6.0–8.3)

## 2010-10-11 LAB — DIFFERENTIAL
Basophils Absolute: 0 10*3/uL (ref 0.0–0.1)
Basophils Relative: 0 % (ref 0–1)
Eosinophils Absolute: 0.2 10*3/uL (ref 0.0–0.7)
Monocytes Relative: 9 % (ref 3–12)
Neutrophils Relative %: 64 % (ref 43–77)

## 2010-10-11 LAB — RAPID URINE DRUG SCREEN, HOSP PERFORMED
Amphetamines: NOT DETECTED
Cocaine: NOT DETECTED
Opiates: NOT DETECTED

## 2010-10-11 LAB — CBC
MCH: 31.1 pg (ref 26.0–34.0)
Platelets: 161 10*3/uL (ref 150–400)
RBC: 4.21 MIL/uL — ABNORMAL LOW (ref 4.22–5.81)
WBC: 9.6 10*3/uL (ref 4.0–10.5)

## 2010-10-11 LAB — ETHANOL: Alcohol, Ethyl (B): 36 mg/dL — ABNORMAL HIGH (ref 0–11)

## 2010-10-16 ENCOUNTER — Inpatient Hospital Stay (HOSPITAL_COMMUNITY)
Admission: EM | Admit: 2010-10-16 | Discharge: 2010-10-22 | DRG: 897 | Disposition: A | Discharge status: Home / Self Care | Payer: Self-pay | Source: Intra-hospital | Attending: Psychiatry | Admitting: Psychiatry

## 2010-10-16 DIAGNOSIS — F1010 Alcohol abuse, uncomplicated: Secondary | ICD-10-CM

## 2010-10-16 DIAGNOSIS — R45851 Suicidal ideations: Secondary | ICD-10-CM

## 2010-10-16 DIAGNOSIS — F22 Delusional disorders: Secondary | ICD-10-CM

## 2010-10-16 DIAGNOSIS — F1320 Sedative, hypnotic or anxiolytic dependence, uncomplicated: Secondary | ICD-10-CM

## 2010-10-16 DIAGNOSIS — F1210 Cannabis abuse, uncomplicated: Secondary | ICD-10-CM

## 2010-10-16 DIAGNOSIS — F39 Unspecified mood [affective] disorder: Secondary | ICD-10-CM

## 2010-10-16 DIAGNOSIS — F609 Personality disorder, unspecified: Secondary | ICD-10-CM

## 2010-10-16 DIAGNOSIS — F259 Schizoaffective disorder, unspecified: Secondary | ICD-10-CM

## 2010-10-16 DIAGNOSIS — F1420 Cocaine dependence, uncomplicated: Secondary | ICD-10-CM

## 2010-10-16 DIAGNOSIS — F411 Generalized anxiety disorder: Secondary | ICD-10-CM

## 2010-10-16 DIAGNOSIS — Z79899 Other long term (current) drug therapy: Secondary | ICD-10-CM

## 2010-10-16 DIAGNOSIS — F1920 Other psychoactive substance dependence, uncomplicated: Principal | ICD-10-CM

## 2010-10-17 DIAGNOSIS — F17200 Nicotine dependence, unspecified, uncomplicated: Secondary | ICD-10-CM

## 2010-10-17 DIAGNOSIS — F1020 Alcohol dependence, uncomplicated: Secondary | ICD-10-CM

## 2010-10-19 DIAGNOSIS — F1994 Other psychoactive substance use, unspecified with psychoactive substance-induced mood disorder: Secondary | ICD-10-CM

## 2010-10-19 DIAGNOSIS — F1920 Other psychoactive substance dependence, uncomplicated: Secondary | ICD-10-CM
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
## 2010-10-27 ENCOUNTER — Emergency Department (HOSPITAL_COMMUNITY)
Admission: EM | Admit: 2010-10-27 | Discharge: 2010-10-28 | Discharge status: Against Medical Advice | Payer: Self-pay | Attending: Emergency Medicine | Admitting: Emergency Medicine

## 2010-10-27 DIAGNOSIS — R112 Nausea with vomiting, unspecified: Secondary | ICD-10-CM | POA: Insufficient documentation

## 2010-10-27 LAB — COMPREHENSIVE METABOLIC PANEL
ALT: 19 U/L (ref 0–53)
AST: 23 U/L (ref 0–37)
Alkaline Phosphatase: 80 U/L (ref 39–117)
CO2: 26 mEq/L (ref 19–32)
Calcium: 9.6 mg/dL (ref 8.4–10.5)
Chloride: 104 mEq/L (ref 96–112)
GFR calc Af Amer: >60 mL/min (ref >60)
GFR calc non Af Amer: >60 mL/min (ref >60)
Glucose, Bld: 79 mg/dL (ref 70–99)
Potassium: 3.8 mEq/L (ref 3.5–5.1)
Sodium: 139 mEq/L (ref 135–145)
Total Bilirubin: 0.4 mg/dL (ref 0.3–1.2)

## 2010-10-27 LAB — DIFFERENTIAL
Basophils Absolute: 0 10*3/uL (ref 0.0–0.1)
Eosinophils Relative: 2 % (ref 0–5)
Lymphocytes Relative: 15 % (ref 12–46)
Lymphs Abs: 2.5 10*3/uL (ref 0.7–4.0)
Neutro Abs: 11.9 10*3/uL — ABNORMAL HIGH (ref 1.7–7.7)

## 2010-10-27 LAB — CBC
HCT: 38.4 % — ABNORMAL LOW (ref 39.0–52.0)
Hemoglobin: 13.1 g/dL (ref 13.0–17.0)
MCV: 91.2 fL (ref 78.0–100.0)
RDW: 14.5 % (ref 11.5–15.5)
WBC: 16.5 10*3/uL — ABNORMAL HIGH (ref 4.0–10.5)

## 2010-10-27 LAB — PROTIME-INR: INR: 1.03 (ref 0.00–1.49)

## 2010-10-27 LAB — GLUCOSE, CAPILLARY

## 2010-10-30 ENCOUNTER — Emergency Department (HOSPITAL_COMMUNITY)
Admission: EM | Admit: 2010-10-30 | Discharge: 2010-10-30 | Disposition: A | Discharge status: Home / Self Care | Payer: Self-pay | Attending: Emergency Medicine | Admitting: Emergency Medicine

## 2010-10-30 DIAGNOSIS — R079 Chest pain, unspecified: Secondary | ICD-10-CM | POA: Insufficient documentation

## 2010-10-30 DIAGNOSIS — R6889 Other general symptoms and signs: Secondary | ICD-10-CM | POA: Insufficient documentation

## 2010-10-30 DIAGNOSIS — G40909 Epilepsy, unspecified, not intractable, without status epilepticus: Secondary | ICD-10-CM | POA: Insufficient documentation

## 2010-10-30 DIAGNOSIS — R4182 Altered mental status, unspecified: Secondary | ICD-10-CM | POA: Insufficient documentation

## 2010-10-30 DIAGNOSIS — Z79899 Other long term (current) drug therapy: Secondary | ICD-10-CM | POA: Insufficient documentation

## 2010-10-30 DIAGNOSIS — F3289 Other specified depressive episodes: Secondary | ICD-10-CM | POA: Insufficient documentation

## 2010-10-30 DIAGNOSIS — F909 Attention-deficit hyperactivity disorder, unspecified type: Secondary | ICD-10-CM | POA: Insufficient documentation

## 2010-10-30 DIAGNOSIS — F329 Major depressive disorder, single episode, unspecified: Secondary | ICD-10-CM | POA: Insufficient documentation

## 2010-10-30 LAB — POCT I-STAT, CHEM 8
Creatinine, Ser: 0.9 mg/dL (ref 0.50–1.35)
HCT: 39 % (ref 39.0–52.0)
Hemoglobin: 13.3 g/dL (ref 13.0–17.0)
Potassium: 3.8 mEq/L (ref 3.5–5.1)
Sodium: 141 mEq/L (ref 135–145)
TCO2: 22 mmol/L (ref 0–100)

## 2010-10-30 LAB — DIFFERENTIAL
Basophils Absolute: 0 10*3/uL (ref 0.0–0.1)
Basophils Relative: 0 % (ref 0–1)
Eosinophils Absolute: 0.3 10*3/uL (ref 0.0–0.7)
Eosinophils Relative: 3 % (ref 0–5)
Monocytes Absolute: 0.8 10*3/uL (ref 0.1–1.0)
Monocytes Relative: 8 % (ref 3–12)
Neutro Abs: 6.3 10*3/uL (ref 1.7–7.7)

## 2010-10-30 LAB — CBC
Hemoglobin: 12.4 g/dL — ABNORMAL LOW (ref 13.0–17.0)
MCH: 31.2 pg (ref 26.0–34.0)
MCHC: 34.7 g/dL (ref 30.0–36.0)
RDW: 14.4 % (ref 11.5–15.5)

## 2010-10-30 LAB — RAPID URINE DRUG SCREEN, HOSP PERFORMED
Amphetamines: NOT DETECTED
Tetrahydrocannabinol: POSITIVE — AB

## 2010-10-30 LAB — VALPROIC ACID LEVEL: Valproic Acid Lvl: 41.8 ug/mL — ABNORMAL LOW (ref 50.0–100.0)

## 2010-11-04 ENCOUNTER — Emergency Department (HOSPITAL_COMMUNITY)
Admission: EM | Admit: 2010-11-04 | Discharge: 2010-11-04 | Disposition: A | Discharge status: Home / Self Care | Payer: Self-pay | Attending: Emergency Medicine | Admitting: Emergency Medicine

## 2010-11-04 DIAGNOSIS — R112 Nausea with vomiting, unspecified: Secondary | ICD-10-CM | POA: Insufficient documentation

## 2010-11-04 DIAGNOSIS — F259 Schizoaffective disorder, unspecified: Secondary | ICD-10-CM | POA: Insufficient documentation

## 2010-11-04 DIAGNOSIS — J45909 Unspecified asthma, uncomplicated: Secondary | ICD-10-CM | POA: Insufficient documentation

## 2010-11-04 DIAGNOSIS — R109 Unspecified abdominal pain: Secondary | ICD-10-CM | POA: Insufficient documentation

## 2010-11-04 DIAGNOSIS — R569 Unspecified convulsions: Secondary | ICD-10-CM | POA: Insufficient documentation

## 2010-11-04 DIAGNOSIS — F909 Attention-deficit hyperactivity disorder, unspecified type: Secondary | ICD-10-CM | POA: Insufficient documentation

## 2010-11-04 LAB — RAPID URINE DRUG SCREEN, HOSP PERFORMED
Barbiturates: NOT DETECTED
Benzodiazepines: NOT DETECTED
Cocaine: NOT DETECTED

## 2010-11-04 LAB — CBC
Platelets: 168 10*3/uL (ref 150–400)
RBC: 4.22 MIL/uL (ref 4.22–5.81)
RDW: 15 % (ref 11.5–15.5)
WBC: 14.4 10*3/uL — ABNORMAL HIGH (ref 4.0–10.5)

## 2010-11-04 LAB — DIFFERENTIAL
Basophils Absolute: 0 10*3/uL (ref 0.0–0.1)
Eosinophils Absolute: 0.7 10*3/uL (ref 0.0–0.7)
Eosinophils Relative: 5 % (ref 0–5)
Lymphocytes Relative: 27 % (ref 12–46)
Neutrophils Relative %: 60 % (ref 43–77)

## 2010-11-04 LAB — COMPREHENSIVE METABOLIC PANEL
ALT: 19 U/L (ref 0–53)
AST: 21 U/L (ref 0–37)
Albumin: 3.6 g/dL (ref 3.5–5.2)
Alkaline Phosphatase: 96 U/L (ref 39–117)
Chloride: 105 mEq/L (ref 96–112)
Potassium: 3.7 mEq/L (ref 3.5–5.1)
Sodium: 137 mEq/L (ref 135–145)
Total Bilirubin: 0.2 mg/dL — ABNORMAL LOW (ref 0.3–1.2)

## 2010-11-04 LAB — VALPROIC ACID LEVEL: Valproic Acid Lvl: <10 ug/mL — ABNORMAL LOW (ref 50.0–100.0)

## 2010-11-04 LAB — URINALYSIS, ROUTINE W REFLEX MICROSCOPIC
Bilirubin Urine: NEGATIVE
Glucose, UA: NEGATIVE mg/dL
Hgb urine dipstick: NEGATIVE
Nitrite: NEGATIVE
Specific Gravity, Urine: 1.037 — ABNORMAL HIGH (ref 1.005–1.030)
pH: 6 (ref 5.0–8.0)

## 2010-11-04 LAB — SAMPLE TO BLOOD BANK

## 2010-11-06 ENCOUNTER — Inpatient Hospital Stay (HOSPITAL_COMMUNITY): Admission: EM | Admit: 2010-11-06 | Payer: Self-pay | Source: Home / Self Care | Admitting: Psychiatry

## 2010-11-06 ENCOUNTER — Emergency Department (HOSPITAL_COMMUNITY)
Admission: EM | Admit: 2010-11-06 | Discharge: 2010-11-08 | Disposition: A | Discharge status: Home / Self Care | Payer: Self-pay | Attending: Emergency Medicine | Admitting: Emergency Medicine

## 2010-11-06 ENCOUNTER — Ambulatory Visit (HOSPITAL_COMMUNITY)
Admission: RE | Admit: 2010-11-06 | Discharge: 2010-11-06 | Disposition: A | Discharge status: Home / Self Care | Payer: Self-pay | Attending: Psychiatry | Admitting: Psychiatry

## 2010-11-06 DIAGNOSIS — IMO0002 Reserved for concepts with insufficient information to code with codable children: Secondary | ICD-10-CM | POA: Insufficient documentation

## 2010-11-06 DIAGNOSIS — F39 Unspecified mood [affective] disorder: Secondary | ICD-10-CM | POA: Insufficient documentation

## 2010-11-06 DIAGNOSIS — F1910 Other psychoactive substance abuse, uncomplicated: Secondary | ICD-10-CM | POA: Insufficient documentation

## 2010-11-06 LAB — URINALYSIS, ROUTINE W REFLEX MICROSCOPIC
Ketones, ur: NEGATIVE
Nitrite: NEGATIVE
Specific Gravity, Urine: 1.03
Urobilinogen, UA: 1
pH: 7

## 2010-11-06 LAB — RAPID URINE DRUG SCREEN, HOSP PERFORMED
Barbiturates: NOT DETECTED
Benzodiazepines: NOT DETECTED
Cocaine: NOT DETECTED
Cocaine: NOT DETECTED
Tetrahydrocannabinol: NOT DETECTED

## 2010-11-06 LAB — CBC
HCT: 40.3 % (ref 39.0–52.0)
MCHC: 34.5 g/dL (ref 30.0–36.0)
MCV: 90.4 fL (ref 78.0–100.0)
RDW: 14.6 % (ref 11.5–15.5)

## 2010-11-06 LAB — VALPROIC ACID LEVEL: Valproic Acid Lvl: 61.2 ug/mL (ref 50.0–100.0)

## 2010-11-06 LAB — ETHANOL: Alcohol, Ethyl (B): <11 mg/dL (ref 0–11)

## 2010-11-06 LAB — URINE MICROSCOPIC-ADD ON

## 2010-11-06 LAB — COMPREHENSIVE METABOLIC PANEL
AST: 20 U/L (ref 0–37)
Albumin: 3.8 g/dL (ref 3.5–5.2)
BUN: 7 mg/dL (ref 6–23)
Calcium: 9.6 mg/dL (ref 8.4–10.5)
Creatinine, Ser: 0.83 mg/dL (ref 0.50–1.35)

## 2010-11-06 LAB — DIFFERENTIAL
Eosinophils Relative: 3 % (ref 0–5)
Lymphs Abs: 3 10*3/uL (ref 0.7–4.0)
Monocytes Absolute: 1.1 10*3/uL — ABNORMAL HIGH (ref 0.1–1.0)

## 2010-11-07 LAB — URINALYSIS, ROUTINE W REFLEX MICROSCOPIC
Glucose, UA: NEGATIVE
Ketones, ur: NEGATIVE
Nitrite: NEGATIVE
Protein, ur: NEGATIVE
Urobilinogen, UA: 0.2

## 2010-11-07 LAB — RAPID URINE DRUG SCREEN, HOSP PERFORMED
Cocaine: NOT DETECTED
Opiates: NOT DETECTED
Tetrahydrocannabinol: NOT DETECTED

## 2010-11-07 LAB — BASIC METABOLIC PANEL
BUN: 8
CO2: 29
Calcium: 9.9
Chloride: 106
Creatinine, Ser: 0.89
Glucose, Bld: 98

## 2010-11-07 LAB — DIFFERENTIAL
Basophils Absolute: 0
Basophils Relative: 0
Eosinophils Absolute: 0.3
Lymphocytes Relative: 25
Monocytes Absolute: 0.7
Neutrophils Relative %: 65

## 2010-11-07 LAB — CBC
MCHC: 34.4
MCV: 92.2
Platelets: 149 — ABNORMAL LOW
RBC: 4.69
RDW: 14

## 2010-11-07 LAB — GC/CHLAMYDIA PROBE AMP, URINE: GC Probe Amp, Urine: NEGATIVE

## 2010-11-24 ENCOUNTER — Emergency Department (HOSPITAL_COMMUNITY)
Admission: EM | Admit: 2010-11-24 | Discharge: 2010-11-24 | Disposition: A | Discharge status: Home / Self Care | Payer: Self-pay | Attending: Emergency Medicine | Admitting: Emergency Medicine

## 2010-11-24 DIAGNOSIS — R4182 Altered mental status, unspecified: Secondary | ICD-10-CM | POA: Insufficient documentation

## 2010-11-24 DIAGNOSIS — F1910 Other psychoactive substance abuse, uncomplicated: Secondary | ICD-10-CM | POA: Insufficient documentation

## 2010-11-24 LAB — CBC
HCT: 36.2 % — ABNORMAL LOW (ref 39.0–52.0)
Hemoglobin: 12.3 g/dL — ABNORMAL LOW (ref 13.0–17.0)
MCH: 31.3 pg (ref 26.0–34.0)
MCHC: 34 g/dL (ref 30.0–36.0)
MCV: 92.1 fL (ref 78.0–100.0)
Platelets: 155 10*3/uL (ref 150–400)
RBC: 3.93 MIL/uL — ABNORMAL LOW (ref 4.22–5.81)
RDW: 14.8 % (ref 11.5–15.5)
WBC: 14.5 10*3/uL — ABNORMAL HIGH (ref 4.0–10.5)

## 2010-11-24 LAB — URINALYSIS, ROUTINE W REFLEX MICROSCOPIC
Glucose, UA: NEGATIVE mg/dL
Hgb urine dipstick: NEGATIVE
Leukocytes, UA: NEGATIVE
Nitrite: NEGATIVE
Protein, ur: 30 mg/dL — AB
Specific Gravity, Urine: 1.029 (ref 1.005–1.030)
Urobilinogen, UA: 1 mg/dL (ref 0.0–1.0)
pH: 6.5 (ref 5.0–8.0)

## 2010-11-24 LAB — COMPREHENSIVE METABOLIC PANEL
ALT: 15 U/L (ref 0–53)
Albumin: 3.5 g/dL (ref 3.5–5.2)
Alkaline Phosphatase: 87 U/L (ref 39–117)
Chloride: 104 mEq/L (ref 96–112)
Potassium: 3.3 mEq/L — ABNORMAL LOW (ref 3.5–5.1)
Sodium: 139 mEq/L (ref 135–145)
Total Protein: 6.7 g/dL (ref 6.0–8.3)

## 2010-11-24 LAB — DIFFERENTIAL
Basophils Absolute: 0 10*3/uL (ref 0.0–0.1)
Eosinophils Absolute: 0.1 10*3/uL (ref 0.0–0.7)
Eosinophils Relative: 1 % (ref 0–5)
Lymphocytes Relative: 10 % — ABNORMAL LOW (ref 12–46)
Neutrophils Relative %: 82 % — ABNORMAL HIGH (ref 43–77)

## 2010-11-24 LAB — ETHANOL: Alcohol, Ethyl (B): 73 mg/dL — ABNORMAL HIGH (ref 0–11)

## 2010-11-24 LAB — SALICYLATE LEVEL: Salicylate Lvl: <2 mg/dL — ABNORMAL LOW (ref 2.8–20.0)

## 2010-11-24 LAB — GLUCOSE, CAPILLARY

## 2010-11-24 LAB — RAPID URINE DRUG SCREEN, HOSP PERFORMED
Amphetamines: NOT DETECTED
Barbiturates: NOT DETECTED
Benzodiazepines: NOT DETECTED
Tetrahydrocannabinol: POSITIVE — AB

## 2010-11-24 LAB — ACETAMINOPHEN LEVEL: Acetaminophen (Tylenol), Serum: <15 ug/mL (ref 10–30)

## 2010-11-25 ENCOUNTER — Encounter (HOSPITAL_COMMUNITY): Payer: Self-pay | Admitting: Physician Assistant

## 2010-11-26 LAB — BASIC METABOLIC PANEL
BUN: 11
CO2: 29
Calcium: 9.3
Chloride: 104
Creatinine, Ser: 1.12

## 2010-11-26 LAB — GAMMA GT: GGT: 16

## 2010-11-26 LAB — GC/CHLAMYDIA PROBE AMP, URINE
Chlamydia, Swab/Urine, PCR: NEGATIVE
GC Probe Amp, Urine: NEGATIVE

## 2010-11-26 LAB — CBC
MCHC: 34.4
MCV: 92.7
Platelets: 166
RBC: 4.24

## 2010-11-26 LAB — TSH: TSH: 1.31

## 2010-11-26 LAB — HEPATIC FUNCTION PANEL
ALT: 17
Albumin: 4
Alkaline Phosphatase: 127
Indirect Bilirubin: 0.6
Total Bilirubin: 0.7

## 2010-11-26 LAB — URINALYSIS, ROUTINE W REFLEX MICROSCOPIC
Glucose, UA: NEGATIVE
Hgb urine dipstick: NEGATIVE
Protein, ur: NEGATIVE

## 2010-11-26 LAB — DRUGS OF ABUSE SCREEN W/O ALC, ROUTINE URINE
Barbiturate Quant, Ur: NEGATIVE
Cocaine Metabolites: NEGATIVE
Creatinine,U: 498.6
Methadone: NEGATIVE

## 2010-11-28 ENCOUNTER — Emergency Department (HOSPITAL_COMMUNITY)
Admission: EM | Admit: 2010-11-28 | Discharge: 2010-11-29 | Disposition: A | Discharge status: Home / Self Care | Payer: Self-pay | Attending: Emergency Medicine | Admitting: Emergency Medicine

## 2010-11-28 ENCOUNTER — Emergency Department (HOSPITAL_COMMUNITY)
Admission: EM | Admit: 2010-11-28 | Discharge: 2010-11-28 | Disposition: A | Discharge status: Home / Self Care | Payer: Self-pay | Attending: Emergency Medicine | Admitting: Emergency Medicine

## 2010-11-28 DIAGNOSIS — J029 Acute pharyngitis, unspecified: Secondary | ICD-10-CM | POA: Insufficient documentation

## 2010-11-28 DIAGNOSIS — R110 Nausea: Secondary | ICD-10-CM | POA: Insufficient documentation

## 2010-11-28 DIAGNOSIS — F209 Schizophrenia, unspecified: Secondary | ICD-10-CM | POA: Insufficient documentation

## 2010-11-28 DIAGNOSIS — R1310 Dysphagia, unspecified: Secondary | ICD-10-CM | POA: Insufficient documentation

## 2010-11-28 DIAGNOSIS — R0989 Other specified symptoms and signs involving the circulatory and respiratory systems: Secondary | ICD-10-CM | POA: Insufficient documentation

## 2010-11-28 DIAGNOSIS — F319 Bipolar disorder, unspecified: Secondary | ICD-10-CM | POA: Insufficient documentation

## 2010-11-28 DIAGNOSIS — R05 Cough: Secondary | ICD-10-CM | POA: Insufficient documentation

## 2010-11-28 DIAGNOSIS — J45909 Unspecified asthma, uncomplicated: Secondary | ICD-10-CM | POA: Insufficient documentation

## 2010-11-28 DIAGNOSIS — R0609 Other forms of dyspnea: Secondary | ICD-10-CM | POA: Insufficient documentation

## 2010-11-28 DIAGNOSIS — R059 Cough, unspecified: Secondary | ICD-10-CM | POA: Insufficient documentation

## 2011-01-07 ENCOUNTER — Emergency Department (HOSPITAL_COMMUNITY)
Admission: EM | Admit: 2011-01-07 | Discharge: 2011-01-08 | Disposition: A | Discharge status: Other Acute Inpatient Hospital | Payer: Self-pay | Attending: Emergency Medicine | Admitting: Emergency Medicine

## 2011-01-07 ENCOUNTER — Encounter: Payer: Self-pay | Admitting: *Deleted

## 2011-01-07 DIAGNOSIS — F329 Major depressive disorder, single episode, unspecified: Secondary | ICD-10-CM | POA: Insufficient documentation

## 2011-01-07 DIAGNOSIS — R4587 Impulsiveness: Secondary | ICD-10-CM | POA: Insufficient documentation

## 2011-01-07 DIAGNOSIS — R45851 Suicidal ideations: Secondary | ICD-10-CM | POA: Insufficient documentation

## 2011-01-07 DIAGNOSIS — F3289 Other specified depressive episodes: Secondary | ICD-10-CM | POA: Insufficient documentation

## 2011-01-07 LAB — RAPID URINE DRUG SCREEN, HOSP PERFORMED
Cocaine: NOT DETECTED
Opiates: NOT DETECTED
Tetrahydrocannabinol: NOT DETECTED

## 2011-01-07 LAB — POCT I-STAT, CHEM 8
Chloride: 105 mEq/L (ref 96–112)
Creatinine, Ser: 1 mg/dL (ref 0.50–1.35)
Glucose, Bld: 104 mg/dL — ABNORMAL HIGH (ref 70–99)
Hemoglobin: 14.6 g/dL (ref 13.0–17.0)
Potassium: 4 mEq/L (ref 3.5–5.1)
Sodium: 142 mEq/L (ref 135–145)

## 2011-01-07 LAB — DIFFERENTIAL
Basophils Absolute: 0.1 10*3/uL (ref 0.0–0.1)
Basophils Relative: 1 % (ref 0–1)
Eosinophils Absolute: 0.6 10*3/uL (ref 0.0–0.7)
Lymphocytes Relative: 32 % (ref 12–46)
Monocytes Absolute: 1 10*3/uL (ref 0.1–1.0)
Neutrophils Relative %: 54 % (ref 43–77)

## 2011-01-07 LAB — CBC
Hemoglobin: 14.1 g/dL (ref 13.0–17.0)
MCH: 30.8 pg (ref 26.0–34.0)
MCHC: 33.2 g/dL (ref 30.0–36.0)
Platelets: 156 10*3/uL (ref 150–400)
RDW: 14.3 % (ref 11.5–15.5)

## 2011-01-07 MED ORDER — LORAZEPAM 1 MG PO TABS: 1.0000 mg | ORAL_TABLET | Freq: Three times a day (TID) | ORAL | Status: DC | PRN

## 2011-01-07 MED ORDER — ACETAMINOPHEN 325 MG PO TABS: 650.0000 mg | ORAL_TABLET | ORAL | Status: DC | PRN

## 2011-01-07 MED ORDER — ZOLPIDEM TARTRATE 5 MG PO TABS: 5.0000 mg | ORAL_TABLET | Freq: Every evening | ORAL | Status: DC | PRN

## 2011-01-07 MED ORDER — IBUPROFEN 200 MG PO TABS: 600.0000 mg | ORAL_TABLET | Freq: Three times a day (TID) | ORAL | Status: DC | PRN

## 2011-01-07 MED ORDER — ONDANSETRON HCL 8 MG PO TABS: 4.0000 mg | ORAL_TABLET | Freq: Three times a day (TID) | ORAL | Status: DC | PRN

## 2011-01-07 MED ORDER — ALUM & MAG HYDROXIDE-SIMETH 200-200-20 MG/5ML PO SUSP
30.0000 mL | ORAL | Status: DC | PRN
  Administered 2011-01-07: 30 mL via ORAL
  Filled 2011-01-07: qty 30

## 2011-01-07 NOTE — ED Notes (Signed)
Pt reported indigestion mild to moderate burning sensation low abd. GI assessment WNL

## 2011-01-07 NOTE — ED Notes (Signed)
Belongings inventoried with Research officer, political party, locked in [place] [address]

## 2011-01-07 NOTE — ED Notes (Signed)
Breakfast tray ordered regular diet with no sharps

## 2011-01-07 NOTE — ED Notes (Signed)
Pt requesting discharge to home informed that he is IVC hold and requires evaluation by a physician prior to being released.
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
## 2011-01-07 NOTE — ED Provider Notes (Signed)
History     CSN: [id number] Arrival date & time: 01/07/2011  1:20 AM   None     Chief Complaint  Patient presents with  . Psychiatric Evaluation    (Consider location/radiation/quality/duration/timing/severity/associated sxs/prior treatment) HPI Comments: Sent form [facility] for medical clearance labs  With IVC papers    Patient reports SI due to recent breakup with girlfriend  The history is provided by the patient.    History reviewed. No pertinent past medical history.  History reviewed. No pertinent past surgical history.  History reviewed. No pertinent family history.  History  Substance Use Topics  . Smoking status: Never Smoker   . Smokeless tobacco: Not on file  . Alcohol Use: No      Review of Systems  Constitutional: Negative.   HENT: Negative.   Eyes: Negative.   Respiratory: Negative.   Cardiovascular: Negative.   Gastrointestinal: Negative.   Genitourinary: Negative.   Musculoskeletal: Negative.   Neurological: Negative.   Hematological: Negative.   Psychiatric/Behavioral: Positive for suicidal ideas.    Allergies  Effexor; Gabapentin; and Penicillins  Home Medications  No current outpatient prescriptions on file.  BP 111/64  Pulse 70  Temp 97.7 F (36.5 C)  Resp 20  SpO2 99%  Physical Exam  Nursing note and vitals reviewed. Constitutional: He is oriented to person, place, and time. He appears well-nourished.  HENT:  Head: Normocephalic.  Neck: Neck supple.  Cardiovascular: Normal rate.   Musculoskeletal: Normal range of motion.  Neurological: He is oriented to person, place, and time.  Skin: Skin is warm and dry.  Psychiatric: He expresses impulsivity. He exhibits a depressed mood. He expresses suicidal ideation. He expresses no suicidal plans.    ED Course  Procedures (including critical care time)  Labs Reviewed  CBC - Abnormal; Notable for the following:    WBC 12.3 (*)    All other components within normal limits  POCT  I-STAT, CHEM 8 - Abnormal; Notable for the following:    Glucose, Bld 104 (*)    All other components within normal limits  DIFFERENTIAL  URINE RAPID DRUG SCREEN (HOSP PERFORMED)  ETHANOL  I-STAT, CHEM 8   No results found.   No diagnosis found.    MDM  SI, depression         Arman Filter, NP 01/07/11 [id number]

## 2011-01-07 NOTE — ED Notes (Signed)
Pt. Given morning snack.

## 2011-01-07 NOTE — ED Notes (Signed)
Breakfast tray provided. 

## 2011-01-07 NOTE — ED Notes (Signed)
Pt from [facility] suicidal.  Pt alert  Listening to  Music with head phones

## 2011-01-07 NOTE — ED Notes (Signed)
Onalee Hua, ED tech, now sitting with patient

## 2011-01-07 NOTE — ED Notes (Signed)
Patient sleeping: sitter at bedside 

## 2011-01-07 NOTE — ED Notes (Signed)
Pt. Is alert and calm and cooperative.  Pt. Is waiting for approval to [facility]

## 2011-01-07 NOTE — ED Provider Notes (Signed)
Medical screening examination/treatment/procedure(s) were performed by non-physician practitioner and as supervising physician I was immediately available for consultation/collaboration.   Ayrton Mcvay L [name] Ostrovsky, MD 01/07/11 0700 

## 2011-01-07 NOTE — ED Notes (Signed)
Food tray given to patient. Sitter at bedside.

## 2011-01-07 NOTE — ED Notes (Signed)
Received patient from triage.  Patient brought in by [employer] with IVC papers for suicidal ideation with plan to jump off of a bridge, due to recent break up with girlfriend.  Patient frequently admitted psychiatrically for suicidal ideations, most recent within the last month.  Patient initially taken to [facility] but no beds available so [employer] brought the patient here to ER.  Patient with flat affect and very soft voice but cooperative.  Reports that he does have thoughts of hurting self by jumping off a bridge and has had thoughts in the past, he states that he does want to be admitted and seek help for this.  Patient has been wanded by security and cleared, all belongings removed and bag, will inventory shortly.  CN and AC aware of need for sitter, currently ED tech Victorino Dike at bedside with patient.  Patient up to restroom and urine sample was provided, blood work has been drawn.  Patient is aware that it is a long process before he will be transferred to a facility, meal and beverage provided to patient at this time, will continue to monitor.

## 2011-01-07 NOTE — ED Notes (Signed)
Patient calm cooperative explained plan of care verbalized understanding. Sitter at bedside

## 2011-01-07 NOTE — ED Notes (Signed)
Patient awake ax4 calm cooperative listening to music. Sitter at bedside

## 2011-01-07 NOTE — ED Notes (Signed)
ACT team stated assessed patient and currently waiting for accepting facility.
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
## 2011-01-08 NOTE — ED Provider Notes (Addendum)
  Physical Exam  BP 124/72  Pulse 89  Temp(Src) 97 F (36.1 C) (Oral)  Resp 19  SpO2 99%  Physical Exam  ED Course  Procedures  MDM Patient with no complaints, sleeping overnight.  At this time patient denies suicidal thoughts or depression, laboratory workup reveals no alcohol intoxication, no drugs of abuse, slightly elevated white blood cell count and normal electrolytes.  Vital signs normal.  Awaiting disposition for IVC, suicidal thoughts prompting his visit   Accepted at [facility] by Dr. Wyonia Hough per Berna Spare with ACT team  Vida Roller, MD 01/08/11 1610  Vida Roller, MD 01/08/11 [phone]

## 2011-01-08 NOTE — ED Notes (Signed)
Pt waiting bed on [address] hall at [facility], pt is not candidate for [facility]

## 2011-01-08 NOTE — ED Notes (Signed)
Report called to [phone] [facility]. Spoke with Harrison Mons, Dr. Wyonia Hough accepting. Marcus called sherriff to transport.

## 2011-02-17 ENCOUNTER — Other Ambulatory Visit: Payer: Self-pay

## 2011-02-17 ENCOUNTER — Emergency Department (HOSPITAL_COMMUNITY)
Admission: EM | Admit: 2011-02-17 | Discharge: 2011-02-17 | Disposition: A | Discharge status: Home / Self Care | Payer: Self-pay | Attending: Emergency Medicine | Admitting: Emergency Medicine

## 2011-02-17 ENCOUNTER — Encounter (HOSPITAL_COMMUNITY): Payer: Self-pay | Admitting: Emergency Medicine

## 2011-02-17 DIAGNOSIS — F909 Attention-deficit hyperactivity disorder, unspecified type: Secondary | ICD-10-CM | POA: Insufficient documentation

## 2011-02-17 DIAGNOSIS — R45851 Suicidal ideations: Secondary | ICD-10-CM | POA: Insufficient documentation

## 2011-02-17 DIAGNOSIS — J45909 Unspecified asthma, uncomplicated: Secondary | ICD-10-CM | POA: Insufficient documentation

## 2011-02-17 DIAGNOSIS — F39 Unspecified mood [affective] disorder: Secondary | ICD-10-CM

## 2011-02-17 DIAGNOSIS — F3160 Bipolar disorder, current episode mixed, unspecified: Secondary | ICD-10-CM | POA: Insufficient documentation

## 2011-02-17 DIAGNOSIS — F1910 Other psychoactive substance abuse, uncomplicated: Secondary | ICD-10-CM | POA: Insufficient documentation

## 2011-02-17 DIAGNOSIS — F22 Delusional disorders: Secondary | ICD-10-CM | POA: Insufficient documentation

## 2011-02-17 HISTORY — DX: Other psychoactive substance abuse, uncomplicated: F19.10

## 2011-02-17 HISTORY — DX: Unspecified convulsions: R56.9

## 2011-02-17 HISTORY — DX: Other symptoms and signs involving emotional state: R45.89

## 2011-02-17 HISTORY — DX: Borderline personality disorder: F60.3

## 2011-02-17 HISTORY — DX: Paranoid schizophrenia: F20.0

## 2011-02-17 HISTORY — DX: Attention-deficit hyperactivity disorder, unspecified type: F90.9

## 2011-02-17 HISTORY — DX: Other symptoms and signs involving appearance and behavior: R46.89

## 2011-02-17 HISTORY — DX: Post-traumatic stress disorder, unspecified: F43.10

## 2011-02-17 LAB — DIFFERENTIAL
Basophils Absolute: 0.1 10*3/uL (ref 0.0–0.1)
Eosinophils Absolute: 0.4 10*3/uL (ref 0.0–0.7)
Lymphocytes Relative: 21 % (ref 12–46)
Monocytes Absolute: 1.4 10*3/uL — ABNORMAL HIGH (ref 0.1–1.0)
Neutrophils Relative %: 64 % (ref 43–77)

## 2011-02-17 LAB — CBC
Hemoglobin: 13.9 g/dL (ref 13.0–17.0)
MCH: 31.2 pg (ref 26.0–34.0)
MCHC: 33.8 g/dL (ref 30.0–36.0)
Platelets: 140 10*3/uL — ABNORMAL LOW (ref 150–400)

## 2011-02-17 LAB — COMPREHENSIVE METABOLIC PANEL
ALT: 18 U/L (ref 0–53)
AST: 18 U/L (ref 0–37)
CO2: 27 mEq/L (ref 19–32)
Calcium: 9.6 mg/dL (ref 8.4–10.5)
Chloride: 105 mEq/L (ref 96–112)
Creatinine, Ser: 0.84 mg/dL (ref 0.50–1.35)
GFR calc Af Amer: >90 mL/min (ref >90)
GFR calc non Af Amer: >90 mL/min (ref >90)
Glucose, Bld: 92 mg/dL (ref 70–99)
Sodium: 138 mEq/L (ref 135–145)
Total Bilirubin: 0.2 mg/dL — ABNORMAL LOW (ref 0.3–1.2)

## 2011-02-17 LAB — RAPID URINE DRUG SCREEN, HOSP PERFORMED
Cocaine: NOT DETECTED
Opiates: NOT DETECTED

## 2011-02-17 NOTE — ED Notes (Signed)
ACT team at bedside.  

## 2011-02-17 NOTE — ED Notes (Signed)
Pt wanting a sitter & something to eat, cooperative at this time, slow to respond to some questions.

## 2011-02-17 NOTE — ED Notes (Addendum)
Pt sleeping, ACT team in to see pt,  at [facility].

## 2011-02-17 NOTE — ED Notes (Signed)
Pt ambulated to room 28 for telepsyh

## 2011-02-17 NOTE — ED Notes (Signed)
EDP requesting pt be moved to yellow.
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
## 2011-02-17 NOTE — ED Provider Notes (Signed)
History     CSN: [id number]  Arrival date & time 02/17/11  0207   First MD Initiated Contact with Patient 02/17/11 0242      Chief Complaint  Patient presents with  . Drug Problem    pt used crack, heroine and synthetic marijuana tonight.   . Suicidal    "not sure what i might do to myself, i am not safe"    (Consider location/radiation/quality/duration/timing/severity/associated sxs/prior treatment) HPI Comments: 20 year old male with a history of drug abuse, suicidal behavior, PTSD, paranoid schizophrenia, borderline personality disorder who presents with a complaint of "I need to be checked out to make sure I'm safe". He states that he has been using lots of different kinds of drugs including cocaine, heroine,  Pills, marijuana. He states that this evening he used several different medications, started to walk home and felt like his legs were weak causing him to fall down onto the ground. He stated that he has intermittent suicidal thoughts and wanted to be checked out. He called the ambulance for transport. Patient denies physical symptoms including headache, blurred vision, chest pain, nausea or vomiting. He states that the weakness has resolved. He admits to not having anything to eat or drink in 2 days stating that he has been using drugs and has not wanted to eat.  He states that approximately 3-4 weeks ago he was on top of a bridge and was contemplating jumping but the police talked him down.  Patient is a 20 y.o. male presenting with drug problem. The history is provided by the patient and medical records.  Drug Problem    Past Medical History  Diagnosis Date  . Seizures   . Drug abuse   . Suicidal behavior   . Borderline personality disorder     per pt  . PTSD (post-traumatic stress disorder)     per pt  . ADHD (attention deficit hyperactivity disorder)     per pt  . Paranoid schizophrenia     per pt  . Asthma     History reviewed. No pertinent past surgical  history.  History reviewed. No pertinent family history.  History  Substance Use Topics  . Smoking status: Never Smoker   . Smokeless tobacco: Not on file  . Alcohol Use: Yes      Review of Systems  All other systems reviewed and are negative.    Allergies  Effexor; Gabapentin; and Penicillins  Home Medications  No current outpatient prescriptions on file.  BP 111/66  Pulse 73  Temp(Src) 98 F (36.7 C) (Oral)  Resp 20  SpO2 98%  Physical Exam  Nursing note and vitals reviewed. Constitutional: He appears well-developed and well-nourished. No distress.  HENT:  Head: Normocephalic and atraumatic.  Mouth/Throat: Oropharynx is clear and moist. No oropharyngeal exudate.  Eyes: Conjunctivae and EOM are normal. Pupils are equal, round, and reactive to light. Right eye exhibits no discharge. Left eye exhibits no discharge. No scleral icterus.  Neck: Normal range of motion. Neck supple. No JVD present. No thyromegaly present.  Cardiovascular: Normal rate, regular rhythm, normal heart sounds and intact distal pulses.  Exam reveals no gallop and no friction rub.   No murmur heard. Pulmonary/Chest: Effort normal and breath sounds normal. No respiratory distress. He has no wheezes. He has no rales.  Abdominal: Soft. Bowel sounds are normal. He exhibits no distension and no mass. There is no tenderness.  Musculoskeletal: Normal range of motion. He exhibits no edema and no tenderness.  Lymphadenopathy:  He has no cervical adenopathy.  Neurological: He is alert. Coordination normal.  Skin: Skin is warm and dry. No rash noted. No erythema.  Psychiatric:       Bizarre affect, paranoia of people that are out to get him, no command hallucinations, no active suicidal thoughts    ED Course  Procedures (including critical care time)  Labs Reviewed  COMPREHENSIVE METABOLIC PANEL - Abnormal; Notable for the following:    Total Bilirubin 0.2 (*)    All other components within normal  limits  CBC - Abnormal; Notable for the following:    WBC 12.5 (*)    Platelets 140 (*)    All other components within normal limits  DIFFERENTIAL - Abnormal; Notable for the following:    Neutro Abs 8.0 (*)    Monocytes Absolute 1.4 (*)    All other components within normal limits  URINE RAPID DRUG SCREEN (HOSP PERFORMED) - Abnormal; Notable for the following:    Tetrahydrocannabinol POSITIVE (*)    All other components within normal limits  ETHANOL   No results found.   No diagnosis found.    MDM  At this time the patient denies active suicidal thoughts but feels like he is unsafe. Vital signs are normal, laboratory workup reveals a slight leukocytosis, negative alcohol, normal compressive metabolic panel and a drug screen positive only for marijuana. Will have behavioral health assessment team consult for possible placement though patient may have some malingering behavior. He does state that he is not suicidal actively and that part of the reason that he called for transport was because it was cold and he didn't want to walk all the way home and had nothing to eat.  Of note he was transferred to [facility] approximately 5 weeks ago. This was for inpatient treatment for suicidal thoughts after being involuntarily committed   ED ECG REPORT   Date: 02/17/2011   Rate: 61  Rhythm: normal sinus rhythm  QRS Axis: normal  Intervals: Intraventricular conduction delay  ST/T Wave abnormalities: early repolarization  Conduction Disutrbances: Incomplete right bundle branch block  Narrative Interpretation:   Old EKG Reviewed: Since last tracing from 11/24/2010, no acute changes found  Change of shift - care signed out to Dr. Freida Busman.  ACT team to reevaluate this morning.  Vida Roller, MD 02/17/11 [phone]

## 2011-02-17 NOTE — ED Provider Notes (Addendum)
Pt awake, alert, content. Discussed w act team - dispo pending. Will get telepsych consult.   telepsych md has evaluated pt, indicates pt stable for d/c home.  Pt is currently alert, oriented, has normal mood/affect. Pt denies thoughts of self to harm or others. Is not exhibiting any delusional thoughts, no hallucinations.  Act team is providing referrals for outpatient follow up.   Suzi Roots, MD 02/17/11 9147  Suzi Roots, MD 02/17/11 [phone]
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
## 2011-02-17 NOTE — ED Notes (Signed)
Pt falling asleep and not able to be interviewed by ACT team at this time. Pt nodding off and unable to keep eyes open.

## 2011-02-17 NOTE — ED Notes (Signed)
Pt belongings inventoried, locked in [place] [address]
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
## 2011-02-25 ENCOUNTER — Emergency Department (HOSPITAL_COMMUNITY): Payer: Self-pay

## 2011-02-25 ENCOUNTER — Encounter: Payer: Self-pay | Admitting: Emergency Medicine

## 2011-02-25 ENCOUNTER — Emergency Department (HOSPITAL_COMMUNITY)
Admission: EM | Admit: 2011-02-25 | Discharge: 2011-02-26 | Disposition: A | Discharge status: Home / Self Care | Payer: Self-pay | Attending: Emergency Medicine | Admitting: Emergency Medicine

## 2011-02-25 ENCOUNTER — Emergency Department (HOSPITAL_COMMUNITY)
Admission: EM | Admit: 2011-02-25 | Discharge: 2011-02-25 | Discharge status: Against Medical Advice | Payer: Self-pay | Attending: Emergency Medicine | Admitting: Emergency Medicine

## 2011-02-25 DIAGNOSIS — M79609 Pain in unspecified limb: Secondary | ICD-10-CM | POA: Insufficient documentation

## 2011-02-25 DIAGNOSIS — Z0389 Encounter for observation for other suspected diseases and conditions ruled out: Secondary | ICD-10-CM | POA: Insufficient documentation

## 2011-02-25 DIAGNOSIS — S8010XA Contusion of unspecified lower leg, initial encounter: Secondary | ICD-10-CM | POA: Insufficient documentation

## 2011-02-25 DIAGNOSIS — F3130 Bipolar disorder, current episode depressed, mild or moderate severity, unspecified: Secondary | ICD-10-CM | POA: Insufficient documentation

## 2011-02-25 HISTORY — DX: Bipolar disorder, current episode depressed, mild or moderate severity, unspecified: F31.30

## 2011-02-25 HISTORY — DX: Depression, unspecified: F32.A

## 2011-02-25 HISTORY — DX: Major depressive disorder, single episode, unspecified: F32.9

## 2011-02-25 NOTE — ED Notes (Signed)
Pt did not answer x 2 
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
## 2011-02-26 NOTE — ED Notes (Signed)
Ortho tech at bedside applying immobilizer  
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
## 2011-05-20 ENCOUNTER — Encounter (HOSPITAL_COMMUNITY): Payer: Self-pay | Admitting: *Deleted

## 2011-05-20 ENCOUNTER — Ambulatory Visit (HOSPITAL_COMMUNITY)
Admission: RE | Admit: 2011-05-20 | Discharge: 2011-05-20 | Disposition: A | Discharge status: Home / Self Care | Payer: Self-pay | Attending: Psychiatry | Admitting: Psychiatry

## 2011-05-20 ENCOUNTER — Emergency Department (HOSPITAL_COMMUNITY)
Admission: EM | Admit: 2011-05-20 | Discharge: 2011-05-20 | Disposition: A | Discharge status: Home / Self Care | Payer: Self-pay | Attending: Emergency Medicine | Admitting: Emergency Medicine

## 2011-05-20 ENCOUNTER — Other Ambulatory Visit: Payer: Self-pay

## 2011-05-20 ENCOUNTER — Emergency Department (HOSPITAL_COMMUNITY): Payer: Self-pay

## 2011-05-20 DIAGNOSIS — R5383 Other fatigue: Secondary | ICD-10-CM | POA: Insufficient documentation

## 2011-05-20 DIAGNOSIS — M25569 Pain in unspecified knee: Secondary | ICD-10-CM | POA: Insufficient documentation

## 2011-05-20 DIAGNOSIS — R5381 Other malaise: Secondary | ICD-10-CM | POA: Insufficient documentation

## 2011-05-20 DIAGNOSIS — W108XXA Fall (on) (from) other stairs and steps, initial encounter: Secondary | ICD-10-CM | POA: Insufficient documentation

## 2011-05-20 DIAGNOSIS — Z79899 Other long term (current) drug therapy: Secondary | ICD-10-CM | POA: Insufficient documentation

## 2011-05-20 DIAGNOSIS — F909 Attention-deficit hyperactivity disorder, unspecified type: Secondary | ICD-10-CM | POA: Insufficient documentation

## 2011-05-20 DIAGNOSIS — Z8659 Personal history of other mental and behavioral disorders: Secondary | ICD-10-CM | POA: Insufficient documentation

## 2011-05-20 DIAGNOSIS — J45909 Unspecified asthma, uncomplicated: Secondary | ICD-10-CM | POA: Insufficient documentation

## 2011-05-20 DIAGNOSIS — G40909 Epilepsy, unspecified, not intractable, without status epilepticus: Secondary | ICD-10-CM | POA: Insufficient documentation

## 2011-05-20 DIAGNOSIS — M25469 Effusion, unspecified knee: Secondary | ICD-10-CM | POA: Insufficient documentation

## 2011-05-20 DIAGNOSIS — R4182 Altered mental status, unspecified: Secondary | ICD-10-CM | POA: Insufficient documentation

## 2011-05-20 LAB — RAPID URINE DRUG SCREEN, HOSP PERFORMED
Amphetamines: NOT DETECTED
Barbiturates: NOT DETECTED
Benzodiazepines: NOT DETECTED
Cocaine: NOT DETECTED
Opiates: NOT DETECTED
Tetrahydrocannabinol: NOT DETECTED

## 2011-05-20 LAB — CBC
HCT: 37.6 % — ABNORMAL LOW (ref 39.0–52.0)
Hemoglobin: 12.8 g/dL — ABNORMAL LOW (ref 13.0–17.0)
MCH: 31.2 pg (ref 26.0–34.0)
MCHC: 34 g/dL (ref 30.0–36.0)
MCV: 91.7 fL (ref 78.0–100.0)
Platelets: 120 10*3/uL — ABNORMAL LOW (ref 150–400)
RBC: 4.1 MIL/uL — ABNORMAL LOW (ref 4.22–5.81)
RDW: 13.7 % (ref 11.5–15.5)
WBC: 9.5 10*3/uL (ref 4.0–10.5)

## 2011-05-20 LAB — BASIC METABOLIC PANEL
BUN: 20 mg/dL (ref 6–23)
CO2: 27 mEq/L (ref 19–32)
Calcium: 9.2 mg/dL (ref 8.4–10.5)
Chloride: 108 mEq/L (ref 96–112)
Creatinine, Ser: 0.82 mg/dL (ref 0.50–1.35)
GFR calc Af Amer: >90 mL/min (ref >90)
GFR calc non Af Amer: >90 mL/min (ref >90)
Glucose, Bld: 96 mg/dL (ref 70–99)
Potassium: 4.7 mEq/L (ref 3.5–5.1)
Sodium: 141 mEq/L (ref 135–145)

## 2011-05-20 LAB — URINE MICROSCOPIC-ADD ON

## 2011-05-20 LAB — URINALYSIS, ROUTINE W REFLEX MICROSCOPIC
Bilirubin Urine: NEGATIVE
Glucose, UA: NEGATIVE mg/dL
Hgb urine dipstick: NEGATIVE
Leukocytes, UA: NEGATIVE
Nitrite: NEGATIVE
Protein, ur: 30 mg/dL — AB
Specific Gravity, Urine: 1.037 — ABNORMAL HIGH (ref 1.005–1.030)
Urobilinogen, UA: 1 mg/dL (ref 0.0–1.0)
pH: 7.5 (ref 5.0–8.0)

## 2011-05-20 LAB — MAGNESIUM: Magnesium: 2.3 mg/dL (ref 1.5–2.5)

## 2011-05-20 MED ORDER — NALOXONE HCL 0.4 MG/ML IJ SOLN
0.4000 mg | Freq: Once | INTRAMUSCULAR | Status: AC
  Administered 2011-05-20: 0.4 mg via INTRAVENOUS
  Filled 2011-05-20: qty 1

## 2011-05-20 MED ORDER — SODIUM CHLORIDE 0.9 % IV BOLUS (SEPSIS)
1000.0000 mL | Freq: Once | INTRAVENOUS | Status: AC
  Administered 2011-05-20: 1000 mL via INTRAVENOUS
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
## 2011-05-20 NOTE — Discharge Instructions (Signed)
Arthralgia Your caregiver has diagnosed you as suffering from an arthralgia. Arthralgia means there is pain in a joint. This can come from many reasons including:  Bruising the joint which causes soreness (inflammation) in the joint.   Wear and tear on the joints which occur as we grow older (osteoarthritis).   Overusing the joint.   Various forms of arthritis.   Infections of the joint.  Regardless of the cause of pain in your joint, most of these different pains respond to anti-inflammatory drugs and rest. The exception to this is when a joint is infected, and these cases are treated with antibiotics, if it is a bacterial infection. HOME CARE INSTRUCTIONS   Rest the injured area for as long as directed by your caregiver. Then slowly start using the joint as directed by your caregiver and as the pain allows. Crutches as directed may be useful if the ankles, knees or hips are involved. If the knee was splinted or casted, continue use and care as directed. If an stretchy or elastic wrapping bandage has been applied today, it should be removed and re-applied every 3 to 4 hours. It should not be applied tightly, but firmly enough to keep swelling down. Watch toes and feet for swelling, bluish discoloration, coldness, numbness or excessive pain. If any of these problems (symptoms) occur, remove the ace bandage and re-apply more loosely. If these symptoms persist, contact your caregiver or return to this location.   For the first 24 hours, keep the injured extremity elevated on pillows while lying down.   Apply ice for 15 to 20 minutes to the sore joint every couple hours while awake for the first half day. Then 3 to 4 times per day for the first 48 hours. Put the ice in a plastic bag and place a towel between the bag of ice and your skin.   Wear any splinting, casting, elastic bandage applications, or slings as instructed.   Only take over-the-counter or prescription medicines for pain,  discomfort, or fever as directed by your caregiver. Do not use aspirin immediately after the injury unless instructed by your physician. Aspirin can cause increased bleeding and bruising of the tissues.   If you were given crutches, continue to use them as instructed and do not resume weight bearing on the sore joint until instructed.  Persistent pain and inability to use the sore joint as directed for more than 2 to 3 days are warning signs indicating that you should see a caregiver for a follow-up visit as soon as possible. Initially, a hairline fracture (break in bone) may not be evident on X-rays. Persistent pain and swelling indicate that further evaluation, non-weight bearing or use of the joint (use of crutches or slings as instructed), or further X-rays are indicated. X-rays may sometimes not show a small fracture until a week or 10 days later. Make a follow-up appointment with your own caregiver or one to whom we have referred you. A radiologist (specialist in reading X-rays) may read your X-rays. Make sure you know how you are to obtain your X-ray results. Do not assume everything is normal if you do not hear from us. SEEK MEDICAL CARE IF: Bruising, swelling, or pain increases. SEEK IMMEDIATE MEDICAL CARE IF:   Your fingers or toes are numb or blue.   The pain is not responding to medications and continues to stay the same or get worse.   The pain in your joint becomes severe.   You develop a fever over   102 F (38.9 C).   It becomes impossible to move or use the joint.  MAKE SURE YOU:   Understand these instructions.   Will watch your condition.   Will get help right away if you are not doing well or get worse.  Document Released: 02/03/2005 Document Revised: 01/23/2011 Document Reviewed: 09/22/2007 [facility] Patient Information 2012 [place], [place].Confusion Confusion is the inability to think with your usual speed or clarity. Confusion may come on quickly or slowly over time.  How quickly the confusion comes on depends on the cause. Confusion can be due to any number of causes. CAUSES   Concussion, head injury, or head trauma.   Seizures.   Stroke.   Fever.   Senility.   Heightened emotional states like rage or terror.   Mental illness in which the person loses the ability to determine what is real and what is not (hallucinations).   Infections.   Toxic effects from alcohol, drugs, or prescription medicines.   Dehydration and an imbalance of salts in the body (electrolytes).   Lack of sleep.   Low blood sugar (diabetes).   Low levels of oxygen (for example from chronic lung disorders).   Drug interactions or other medication side effects.   Nutritional deficiencies, especially niacin, thiamine, vitamin C, or vitamin B.   Sudden drop in body temperature (hypothermia).   Illness in the elderly. Constipation can result in confusion. An elderly person who is hospitalized may become confused due to change in daily routine.  SYMPTOMS  People often describe their thinking as cloudy or unclear when they are confused. Confusion can also include feeling disoriented. That means you are unaware of where or who you are. You may also not know what the date or time is. If confused, you may also have difficulty paying attention, remembering and making decisions. Some people also act aggressively when they are confused.  DIAGNOSIS  The medical evaluation of confusion may include:  Blood and urine tests.   X-rays.   Brain and nervous system tests.   Analyzing your brain waves (electroencphalogram or EEG).   A special X-ray (MRI) of your head or other special studies.  Your physician will ask questions such as:  Do you get days and nights mixed up?   Are you awake during regular sleep times?   Do you have trouble recognizing people?   Do you know where you are?   Do you know the date and time?   Does the confusion come and go?   Is the  confusion quickly getting worse?   Has there been a recent illness?   Has there been a recent head injury?   Are you diabetic?   Do you have a lung disorder?   What medication are you taking?   Have you taken drugs or alcohol?  TREATMENT  An admission to the hospital may not be needed, but a confused person should not be left alone. Stay with a family member or friend until the confusion clears. Avoid alcohol, pain relievers or sedative drugs until you have fully recovered. Do not drive until your caregiver says it is okay. HOME CARE INSTRUCTIONS What family and friends can do:  To find out if someone is confused ask him or her their name, age, and the date. If the person is unsure or answers incorrectly, he or she is confused.   Always introduce yourself, no matter how well the person knows you.   Often remind the person of his or her  location.   Place a calendar and clock near the confused person.   Talk about current events and plans for the day.   Try to keep the environment calm, quiet and peaceful.   Make sure the patient keeps follow up appointments with their physician.  PREVENTION  Ways to prevent confusion:  Avoid alcohol.   Eat a balanced diet.   Get enough sleep.   Do not become isolated. Spend time with other people and make plans for your days.   Keep careful watch on your blood sugar levels if you are diabetic.  SEEK IMMEDIATE MEDICAL CARE IF:   You develop severe headaches, repeated vomiting, seizures, blackouts or slurred speech.   There is increasing confusion, weakness, numbness, restlessness or personality changes.   You develop a loss of balance, have marked dizziness, feel uncoordinated or fall.   You have delusions, hallucinations or develop severe anxiety.   Your family members think you need to be rechecked.  Document Released: 03/13/2004 Document Revised: 01/23/2011 Document Reviewed: 11/09/2007 [facility] Patient Information 2012  [place], [place].  RESOURCE GUIDE  Dental Problems  Patients with Medicaid: [facility] [phone] [address]                                           [phone] [address] Phone:  [phone]                                                  Phone:  [phone]  If unable to pay or uninsured, contact:  [facility] or [facility]. to become qualified for the adult dental clinic.  Chronic Pain Problems Contact [facility]  [phone] Patients need to be referred by their primary care doctor.  Insufficient Money for Medicine Contact [employer] Way:  call "211" or [facility] [phone].  No Primary Care Doctor Call [facility]  [phone] Other agencies that provide inexpensive medical care    [facility]  [phone]    [facility]  [phone]    [facility]  [phone]    [facility]  [phone]    [facility]  [phone]    [facility]  [phone]  [facility]  [phone] [facility]  [phone] [facility]   [phone] (emergency services [phone])  Substance Abuse Resources [facility]  [phone] [facility] [phone] [facility] [phone] Floyd[facility] Flock [phone] Residential & Outpatient Substance Abuse Program  [phone]  Abuse/Neglect [facility] Child Abuse Hotline 6301132954 [facility] Child Abuse Hotline [phone] (After Hours)  Emergency Shelter [facility] [phone]  M[facility] [place] [facility] [place] [phone] [facility] [phone]  MRSA Hotline #:   718-683-1999    [facility] Resources  [facility]     [employer]                          [facility]. [address] [place]                        [address]      [address]  Blondell Reveal Phone:  [phone]                                   Phone:  [phone]                 Phone:  [phone]  [facility] Phone:  [phone]  [facility] Child Abuse Hotline [phone] (After Hours)

## 2011-05-20 NOTE — ED Notes (Signed)
Attempted to place catheter in patient at which point pt awoke as if nothing was ever wrong.  Pt now alert and oriented x4 with no recollection of events leading up to now.

## 2011-05-20 NOTE — ED Provider Notes (Signed)
History    21 year old male brought in by EMS after a fall. Unclear as to the etiology of this. Patient was apparently at a friend's house when this happened fall was unwitnessed. Is unable to provide history as to what exactly happened. He does deny alcohol or drug use though. but per review of history, has a history of polysubstance abuse. Also history of seizure. There was no seizure activity reported though. Patient's clothes dry.   CSN: [id number] patient is very drowsy.  Arrival date & time 05/20/11  0212   First MD Initiated Contact with Patient 05/20/11 [phone]      Chief Complaint  Patient presents with  . Leg Pain    (Consider location/radiation/quality/duration/timing/severity/associated sxs/prior treatment) HPI  Past Medical History  Diagnosis Date  . Seizures   . Drug abuse   . Suicidal behavior   . Borderline personality disorder     per pt  . PTSD (post-traumatic stress disorder)     per pt  . ADHD (attention deficit hyperactivity disorder)     per pt  . Paranoid schizophrenia     per pt  . Asthma     No past surgical history on file.  No family history on file.  History  Substance Use Topics  . Smoking status: Never Smoker   . Smokeless tobacco: Not on file  . Alcohol Use: Yes      Review of Systems  Level 5 caveat applies because pt is poorly responsive.  Allergies  Effexor; Gabapentin; and Penicillins  Home Medications  No current outpatient prescriptions on file.  BP 110/73  Pulse 75  Temp(Src) 98 F (36.7 C) (Oral)  Resp 20  SpO2 99%  Physical Exam  Nursing note and vitals reviewed. Constitutional: He appears well-developed and well-nourished. No distress.       Laying in bed eyes closed. No acute distress. No external signs of trauma.  HENT:  Head: Normocephalic and atraumatic.  Eyes: Conjunctivae are normal. Pupils are equal, round, and reactive to light. Right eye exhibits no discharge. Left eye exhibits no discharge.    Cardiovascular: Normal rate, regular rhythm and normal heart sounds.  Exam reveals no gallop and no friction rub.   No murmur heard. Pulmonary/Chest: Effort normal and breath sounds normal. No respiratory distress.  Abdominal: Soft. He exhibits no distension. There is no tenderness.  Musculoskeletal: He exhibits no edema and no tenderness.  Neurological:       Drowsy. Opens eyes to sternal rub. Localizes pain with all 4 extremities.  Skin: Skin is warm and dry.    ED Course  Procedures (including critical care time)  Labs Reviewed - No data to display No results found.  EKG:  Rhythm: Normal sinus rhythm Rate: 71 Axis: Normal axis Intervals: Normal ST segments: There is some ST segment elevation in anterior septally which is noted on previous EKG 02/17/2011. There is some slight ST depression isolated to lead 3   1. Knee pain, acute   2. Altered mental state       MDM  21 year old male with decreased mental status after a fall. Per review of records, patient with similar presentations with being gently very tired and difficult to arouse. Psych history. Suspect behavior is willful but initiated medical eval for possible organic cause. When nursing went to place foley pt became awake and alert. Pt still claims that cannot remember what exactly happened. W/u relatively unremarkable. On DC pt AOX3 and nonfocal neurological exam. Patient denies suicidal ideation. There is  no evidence of acute psychosis.      Raeford Razor, MD 05/22/11 2352

## 2011-05-20 NOTE — ED Notes (Signed)
Pt called ems states he fell down 6 concrete stairs, landing on concrete, denies loc, hitting head, neck or upper back pain. C-Collar in place full immobilization. Denies any etoh or drug use this afternoon. Pt has c/o left lower leg/foot  pain, no deformity, +pms, no bruising noted per EMS. VS stable, pt kept falling asleep enroute with medic attempting to assess pt.

## 2011-05-20 NOTE — BHH Counselor (Signed)
Pt presented to [facility] request a court ordered MH evaluation.  Pt was informed that [facility] does not offer psych evals.  Pt appeared disheveled and stated he was just released by [facility] for leg pain.  Pt denied SI/HI and psychosis.  Pt staed he was just released from prison last week but has been to [facility] IP before as early as last month.  Records do not reflect this. Declined assessment, Pt given OP referrals

## 2011-05-20 NOTE — ED Provider Notes (Signed)
History     CSN: [id number]  Arrival date & time 05/20/11  0212   First MD Initiated Contact with Patient 05/20/11 [phone]      Chief Complaint  Patient presents with  . Leg Pain    (Consider location/radiation/quality/duration/timing/severity/associated sxs/prior treatment) HPI  Past Medical History  Diagnosis Date  . Seizures   . Drug abuse   . Suicidal behavior   . Borderline personality disorder     per pt  . PTSD (post-traumatic stress disorder)     per pt  . ADHD (attention deficit hyperactivity disorder)     per pt  . Paranoid schizophrenia     per pt  . Asthma     History reviewed. No pertinent past surgical history.  No family history on file.  History  Substance Use Topics  . Smoking status: Never Smoker   . Smokeless tobacco: Not on file  . Alcohol Use: Yes      Review of Systems  Musculoskeletal: Positive for joint swelling.    Allergies  Effexor; Gabapentin; and Penicillins  Home Medications   Current Outpatient Rx  Name Route Sig Dispense Refill  . DIVALPROEX SODIUM ER 500 MG PO TB24 Oral Take 500 mg by mouth 2 (two) times daily.      BP 106/60  Pulse 62  Temp(Src) 97.8 F (36.6 C) (Oral)  Resp 16  SpO2 100%  Physical Exam  Constitutional: He appears well-developed.  HENT:  Head: Normocephalic.  Eyes: Pupils are equal, round, and reactive to light.  Neck: Normal range of motion.  Cardiovascular: Normal rate.   Pulmonary/Chest: Effort normal.  Genitourinary: Penis normal.  Musculoskeletal: He exhibits tenderness. He exhibits no edema.       Medal tenderness full ROM  Neurological: He is alert.  Skin: Skin is warm.    ED Course  Procedures (including critical care time)  Labs Reviewed  CBC - Abnormal; Notable for the following:    RBC 4.10 (*)    Hemoglobin 12.8 (*)    HCT 37.6 (*)    Platelets 120 (*)    All other components within normal limits  URINALYSIS, ROUTINE W REFLEX MICROSCOPIC - Abnormal; Notable for the  following:    Specific Gravity, Urine 1.037 (*)    Ketones, ur TRACE (*)    Protein, ur 30 (*)    All other components within normal limits  URINE RAPID DRUG SCREEN (HOSP PERFORMED)  BASIC METABOLIC PANEL  MAGNESIUM  URINE MICROSCOPIC-ADD ON   Ct Head Wo Contrast  05/20/2011  *RADIOLOGY REPORT*  Clinical Data: The patient fell down steps, landing on concrete. Lethargy.  The patient in C-collar.  CT HEAD WITHOUT CONTRAST  Technique:  Contiguous axial images were obtained from the base of the skull through the vertex without contrast.  Comparison: 10/09/2010  Findings: The ventricles and sulci appear symmetrical.  No mass effect or midline shift.  No abnormal extra-axial fluid collections.  No ventricular dilatation.  Gray-white matter junctions appear distinct.  Basal cisterns are not effaced.  No evidence of acute intracranial hemorrhage.  No depressed skull fractures.  Visualized paranasal sinuses and mastoid air cells are not opacified.  No significant change since the previous study.  IMPRESSION: No acute intracranial abnormalities.  Original Report Authenticated By: Marlon Pel, M.D.   Ct Cervical Spine Wo Contrast  05/20/2011  *RADIOLOGY REPORT*  Clinical Data: The patient in C-collar after falling down steps and landing on concrete.  CT CERVICAL SPINE WITHOUT CONTRAST  Technique:  Multidetector CT imaging of the cervical spine was performed. Multiplanar CT image reconstructions were also generated.  Comparison: 06/07/2010  Findings: Straightening of the usual cervical lordosis which is likely due to patient positioning but ligamentous injury or muscle spasm can also have this appearance.  Normal alignment of the facet joints.  No vertebral compression deformities.  Intervertebral disc space heights are preserved.  No prevertebral soft tissue swelling. No focal bone lesion or bone destruction.  No paraspinal soft tissue infiltration.  IMPRESSION: Straightened cervical lordosis likely due to  patient positioning although muscle spasm or ligamentous injury can also have this appearance.  No displaced fractures identified.  Original Report Authenticated By: Marlon Pel, M.D.   Dg Knee Complete 4 Views Left  05/20/2011  *RADIOLOGY REPORT*  Clinical Data: Knee pain after fall.  LEFT KNEE - COMPLETE 4+ VIEW  Comparison: None  Findings: The left knee appears intact. No evidence of acute fracture or subluxation.  No focal bone lesions.  Bone matrix and cortex appear intact.  No abnormal radiopaque densities in the soft tissues.  No significant effusion.  IMPRESSION: No acute bony abnormalities demonstrated.  Original Report Authenticated By: Marlon Pel, M.D.     1. Knee pain, acute   2. Altered mental state       MDM  Knee injury        Arman Filter, NP 05/20/11 2004

## 2011-05-21 NOTE — ED Provider Notes (Signed)
Medical screening examination/treatment/procedure(s) were performed by non-physician practitioner and as supervising physician I was immediately available for consultation/collaboration.  Raeford Razor, MD 05/21/11 2130

## 2011-07-14 ENCOUNTER — Emergency Department (HOSPITAL_COMMUNITY)
Admission: EM | Admit: 2011-07-14 | Discharge: 2011-07-14 | Disposition: A | Discharge status: Home / Self Care | Payer: Self-pay | Attending: Emergency Medicine | Admitting: Emergency Medicine

## 2011-07-14 ENCOUNTER — Emergency Department (HOSPITAL_COMMUNITY): Payer: Self-pay

## 2011-07-14 ENCOUNTER — Encounter (HOSPITAL_COMMUNITY): Payer: Self-pay | Admitting: Emergency Medicine

## 2011-07-14 DIAGNOSIS — R112 Nausea with vomiting, unspecified: Secondary | ICD-10-CM | POA: Insufficient documentation

## 2011-07-14 DIAGNOSIS — B349 Viral infection, unspecified: Secondary | ICD-10-CM

## 2011-07-14 DIAGNOSIS — B9789 Other viral agents as the cause of diseases classified elsewhere: Secondary | ICD-10-CM | POA: Insufficient documentation

## 2011-07-14 MED ORDER — SODIUM CHLORIDE 0.9 % IV BOLUS (SEPSIS)
1000.0000 mL | Freq: Once | INTRAVENOUS | Status: AC
  Administered 2011-07-14: 1000 mL via INTRAVENOUS

## 2011-07-14 MED ORDER — ONDANSETRON HCL 4 MG/2ML IJ SOLN
4.0000 mg | Freq: Once | INTRAMUSCULAR | Status: AC
  Administered 2011-07-14: 4 mg via INTRAVENOUS
  Filled 2011-07-14: qty 2

## 2011-07-14 NOTE — ED Notes (Signed)
Per EMS- Pt c/o n/v/d X 3 days. EMS started 20G IV in left AC. Pt also c/o headache and abd pain.

## 2011-07-14 NOTE — Discharge Instructions (Signed)
Antibiotic Nonuse  Your caregiver felt that the infection or problem was not one that would be helped with an antibiotic. Infections may be caused by viruses or bacteria. Only a caregiver can tell which one of these is the likely cause of an illness. A cold is the most common cause of infection in both adults and children. A cold is a virus. Antibiotic treatment will have no effect on a viral infection. Viruses can lead to many lost days of work caring for sick children and many missed days of school. Children may catch as many as 10 "colds" or "flus" per year during which they can be tearful, cranky, and uncomfortable. The goal of treating a virus is aimed at keeping the ill person comfortable. Antibiotics are medications used to help the body fight bacterial infections. There are relatively few types of bacteria that cause infections but there are hundreds of viruses. While both viruses and bacteria cause infection they are very different types of germs. A viral infection will typically go away by itself within 7 to 10 days. Bacterial infections may spread or get worse without antibiotic treatment. Examples of bacterial infections are:  Sore throats (like strep throat or tonsillitis).   Infection in the lung (pneumonia).   Ear and skin infections.  Examples of viral infections are:  Colds or flus.   Most coughs and bronchitis.   Sore throats not caused by Strep.   Runny noses.  It is often best not to take an antibiotic when a viral infection is the cause of the problem. Antibiotics can kill off the helpful bacteria that we have inside our body and allow harmful bacteria to start growing. Antibiotics can cause side effects such as allergies, nausea, and diarrhea without helping to improve the symptoms of the viral infection. Additionally, repeated uses of antibiotics can cause bacteria inside of our body to become resistant. That resistance can be passed onto harmful bacterial. The next time  you have an infection it may be harder to treat if antibiotics are used when they are not needed. Not treating with antibiotics allows our own immune system to develop and take care of infections more efficiently. Also, antibiotics will work better for [place] when they are prescribed for bacterial infections. Treatments for a child that is ill may include:  Give extra fluids throughout the day to stay hydrated.   Get plenty of rest.   Only give your child over-the-counter or prescription medicines for pain, discomfort, or fever as directed by your caregiver.   The use of a cool mist humidifier may help stuffy noses.   Cold medications if suggested by your caregiver.  Your caregiver may decide to start you on an antibiotic if:  The problem you were seen for today continues for a longer length of time than expected.   You develop a secondary bacterial infection.  SEEK MEDICAL CARE IF:  Fever lasts longer than 5 days.   Symptoms continue to get worse after 5 to 7 days or become severe.   Difficulty in breathing develops.   Signs of dehydration develop (poor drinking, rare urinating, dark colored urine).   Changes in behavior or worsening tiredness (listlessness or lethargy).  Document Released: 04/14/2001 Document Revised: 01/23/2011 Document Reviewed: 10/11/2008 [facility] Patient Information 2012 [place], [place].Viral Infections A virus is a type of germ. Viruses can cause:  Minor sore throats.   Aches and pains.   Headaches.   Runny nose.   Rashes.   Watery eyes.   Tiredness.  Coughs.   Loss of appetite.   Feeling sick to your stomach (nausea).   Throwing up (vomiting).   Watery poop (diarrhea).  HOME CARE   Only take medicines as told by your doctor.   Drink enough water and fluids to keep your pee (urine) clear or pale yellow. Sports drinks are a good choice.   Get plenty of rest and eat healthy. Soups and broths with crackers or rice are fine.  GET HELP  RIGHT AWAY IF:   You have a very bad headache.   You have shortness of breath.   You have chest pain or neck pain.   You have an unusual rash.   You cannot stop throwing up.   You have watery poop that does not stop.   You cannot keep fluids down.   You or your child has a temperature by mouth above 102 F (38.9 C), not controlled by medicine.   Your baby is older than 3 months with a rectal temperature of 102 F (38.9 C) or higher.   Your baby is 66 months old or younger with a rectal temperature of 100.4 F (38 C) or higher.  MAKE SURE YOU:   Understand these instructions.   Will watch this condition.   Will get help right away if you are not doing well or get worse.  Document Released: 01/17/2008 Document Revised: 01/23/2011 Document Reviewed: 06/11/2010 [facility] Patient Information 2012 [place], [place].  RESOURCE GUIDE  Dental Problems  Patients with Medicaid: [facility] [phone] [address]                                           [phone] [address] Phone:  [phone]                                                  Phone:  [phone]  If unable to pay or uninsured, contact:  [facility] or [facility]. to become qualified for the adult dental clinic.  Chronic Pain Problems Contact [facility]  [phone] Patients need to be referred by their primary care doctor.  Insufficient Money for Medicine Contact [employer] Way:  call "211" or [facility] [phone].  No Primary Care Doctor Call [facility]  [phone] Other agencies that provide inexpensive medical care    [facility]  [phone]    [facility]  [phone]    [facility]  [phone]    [facility]  [phone]    [facility]anned Parenthood  2168811661    [facility]  [phone]  Psychological Services [facility] Behavioral [facility]  [phone] [facility]   [phone] [facility]   [phone] (emergency services [phone])  Substance Abuse Resources [facility]  [phone] [facility] 4503421007 [facility] [phone] Floydene Flock [phone] Residential & Outpatient Substance Abuse Program  [phone]  Abuse/Neglect [facility] Child Abuse Hotline 365-553-4505 [facility] Child Abuse Hotline 843-803-4973 (After Hours)  Emergency Shelter [facility] ([phone]  M[facility] [place] [facility]  [facility] [place] [phone] [employer] Services [phone]  MRSA Hotline #:   534-173-7098    [facility] Resources  [facility] [place]     [employer]                          [facility] Dept. [address] [place]                       [address]      [address]  Blondell Reveal Phone:  [phone]                                   Phone:  [phone]                 Phone:  [phone]  [facility] Mental Health Phone:  [phone]  [facility] Child Abuse Hotline ([phone] 7052175039 (After Hours)  Viral Syndrome You or your child has Viral Syndrome. It is the most common infection causing "colds" and infections in the nose, throat, sinuses, and breathing tubes. Sometimes the infection causes nausea, vomiting, or diarrhea. The germ that causes the infection is a virus. No antibiotic or other medicine will kill it. There are medicines that you can take to make you or your child more comfortable.  HOME CARE INSTRUCTIONS   Rest in bed until you start to feel better.   If you have diarrhea or vomiting, eat small amounts of crackers and toast. Soup is helpful.   Do not give aspirin or medicine that contains aspirin to children.   Only take over-the-counter or prescription medicines for  pain, discomfort, or fever as directed by your caregiver.  SEEK IMMEDIATE MEDICAL CARE IF:   You or your child has not improved within one week.   You or your child has pain that is not at least partially relieved by over-the-counter medicine.   Thick, colored mucus or blood is coughed up.   Discharge from the nose becomes thick yellow or green.   Diarrhea or vomiting gets worse.   There is any major change in your or your child's condition.   You or your child develops a skin rash, stiff neck, severe headache, or are unable to hold down food or fluid.   You or your child has an oral temperature above 102 F (38.9 C), not controlled by medicine.   Your baby is older than 3 months with a rectal temperature of 102 F (38.9 C) or higher.   Your baby is 60 months old or younger with a rectal temperature of 100.4 F (38 C) or higher.  Document Released: 01/19/2006 Document Revised: 01/23/2011 Document Reviewed: 01/20/2007 [facility] Patient Information 2012 [place], [place].

## 2011-07-14 NOTE — ED Notes (Signed)
Patient transported to CT 

## 2011-07-14 NOTE — ED Notes (Signed)
Patient back from CT.

## 2011-07-14 NOTE — ED Notes (Addendum)
Pt reports he was walking in when he got to tired and called EMS. Pt c/o n/v/d X 3 days,pt stated he thought it was d/t a hangover. Pt also c/o a cough and abdominal pain. Pt stated "my chest hurts when I cough". Pt sounded nasally congested.

## 2011-07-20 NOTE — ED Provider Notes (Signed)
History    20yM with fatigue. Onset today. Just feels generally tired. No fever or chills. Occasional nonproductive cough and sharp CP when he coughs. No SOB. Feels congested. No HA, sore throat or ear pain. No sick contacts. NO rash. No urinary complaints.   CSN: [id number]  Arrival date & time 07/14/11  1736   First MD Initiated Contact with Patient 07/14/11 1741      Chief Complaint  Patient presents with  . Nausea  . Emesis    (Consider location/radiation/quality/duration/timing/severity/associated sxs/prior treatment) HPI  Past Medical History  Diagnosis Date  . Seizures   . Drug abuse   . Suicidal behavior   . Borderline personality disorder     per pt  . PTSD (post-traumatic stress disorder)     per pt  . ADHD (attention deficit hyperactivity disorder)     per pt  . Paranoid schizophrenia     per pt  . Asthma     Past Surgical History  Procedure Date  . Leg surgery     History reviewed. No pertinent family history.  History  Substance Use Topics  . Smoking status: Current Everyday Smoker  . Smokeless tobacco: Not on file  . Alcohol Use: Yes      Review of Systems   Review of symptoms negative unless otherwise noted in HPI.   Allergies  Haldol; Effexor; Gabapentin; and Penicillins  Home Medications   Current Outpatient Rx  Name Route Sig Dispense Refill  . ACETAMINOPHEN 500 MG PO TABS Oral Take 1,500 mg by mouth daily as needed. For headache    . WELLBUTRIN PO Oral Take 1 tablet by mouth daily.    . CHLORPROMAZINE HCL 100 MG PO TABS Oral Take 100 mg by mouth at bedtime as needed. For sleep    . DIVALPROEX SODIUM ER 500 MG PO TB24 Oral Take 500 mg by mouth 2 (two) times daily.    . IBUPROFEN 200 MG PO TABS Oral Take 600 mg by mouth daily as needed. For headache      BP 108/60  Temp(Src) 99.4 F (37.4 C) (Oral)  Resp 17  Ht 6\' 1"  (1.854 m)  Wt 160 lb (72.576 kg)  BMI 21.11 kg/m2  SpO2 95%  Physical Exam  Nursing note and vitals  reviewed. Constitutional: He is oriented to person, place, and time. He appears well-developed and well-nourished. No distress.  HENT:  Head: Normocephalic and atraumatic.  Mouth/Throat: Oropharynx is clear and moist. No oropharyngeal exudate.  Eyes: Conjunctivae are normal. Pupils are equal, round, and reactive to light. Right eye exhibits no discharge. Left eye exhibits no discharge.  Neck: Normal range of motion. Neck supple.  Cardiovascular: Normal rate, regular rhythm and normal heart sounds.  Exam reveals no gallop and no friction rub.   No murmur heard. Pulmonary/Chest: Effort normal and breath sounds normal. No respiratory distress.  Abdominal: Soft. He exhibits no distension. There is no tenderness.  Musculoskeletal: He exhibits no edema and no tenderness.       Lower extremities symmetric as compared to each other. No calf tenderness. Negative Homan's. No palpable cords.   Lymphadenopathy:    He has no cervical adenopathy.  Neurological: He is alert and oriented to person, place, and time.  Skin: Skin is warm and dry.  Psychiatric: He has a normal mood and affect. His behavior is normal. Thought content normal.    ED Course  Procedures (including critical care time)  Labs Reviewed - No data to display No results  found.  Dg Chest 2 View  07/14/2011  *RADIOLOGY REPORT*  Clinical Data: Cough.  Flu like symptoms.  Syncope.  Cough.  CHEST - 2 VIEW  Comparison: 10/09/2010  Findings: Cardiomediastinal silhouette is within normal limits. The lungs are free of focal consolidations and pleural effusions. No edema. Visualized osseous structures have a normal appearance.  IMPRESSION: Negative exam.  Original Report Authenticated By: Patterson Hammersmith, M.D.    1. Viral illness       MDM  20yM with multiple vague complaints. Afebrile and HD stable. Low suspicion for emergent etiology. Possible viral illness. Plan symptomatic tx. Return precautions discussed. Outpt fu  otherwise.        Raeford Razor, MD 07/20/11 1102

## 2011-09-06 ENCOUNTER — Encounter (HOSPITAL_COMMUNITY): Payer: Self-pay | Admitting: *Deleted

## 2011-09-06 ENCOUNTER — Emergency Department (HOSPITAL_COMMUNITY): Payer: Self-pay

## 2011-09-06 ENCOUNTER — Emergency Department (HOSPITAL_COMMUNITY)
Admission: EM | Admit: 2011-09-06 | Discharge: 2011-09-08 | Disposition: A | Discharge status: Home / Self Care | Payer: Self-pay | Attending: Emergency Medicine | Admitting: Emergency Medicine

## 2011-09-06 DIAGNOSIS — F909 Attention-deficit hyperactivity disorder, unspecified type: Secondary | ICD-10-CM | POA: Insufficient documentation

## 2011-09-06 DIAGNOSIS — R45851 Suicidal ideations: Secondary | ICD-10-CM | POA: Insufficient documentation

## 2011-09-06 DIAGNOSIS — L02419 Cutaneous abscess of limb, unspecified: Secondary | ICD-10-CM | POA: Insufficient documentation

## 2011-09-06 DIAGNOSIS — F1910 Other psychoactive substance abuse, uncomplicated: Secondary | ICD-10-CM | POA: Insufficient documentation

## 2011-09-06 DIAGNOSIS — F17200 Nicotine dependence, unspecified, uncomplicated: Secondary | ICD-10-CM | POA: Insufficient documentation

## 2011-09-06 DIAGNOSIS — Z8659 Personal history of other mental and behavioral disorders: Secondary | ICD-10-CM | POA: Insufficient documentation

## 2011-09-06 DIAGNOSIS — L03119 Cellulitis of unspecified part of limb: Secondary | ICD-10-CM

## 2011-09-06 DIAGNOSIS — J45909 Unspecified asthma, uncomplicated: Secondary | ICD-10-CM | POA: Insufficient documentation

## 2011-09-06 LAB — CBC WITH DIFFERENTIAL/PLATELET
Eosinophils Relative: 1 % (ref 0–5)
HCT: 41 % (ref 39.0–52.0)
Lymphs Abs: 2.8 10*3/uL (ref 0.7–4.0)
MCH: 31.3 pg (ref 26.0–34.0)
MCV: 89.1 fL (ref 78.0–100.0)
Monocytes Absolute: 1.8 10*3/uL — ABNORMAL HIGH (ref 0.1–1.0)
Monocytes Relative: 10 % (ref 3–12)
Neutro Abs: 12.6 10*3/uL — ABNORMAL HIGH (ref 1.7–7.7)
Platelets: 136 10*3/uL — ABNORMAL LOW (ref 150–400)
RBC: 4.6 MIL/uL (ref 4.22–5.81)
RDW: 13.5 % (ref 11.5–15.5)
WBC: 17.6 10*3/uL — ABNORMAL HIGH (ref 4.0–10.5)

## 2011-09-06 LAB — COMPREHENSIVE METABOLIC PANEL
BUN: 19 mg/dL (ref 6–23)
CO2: 22 mEq/L (ref 19–32)
Calcium: 10 mg/dL (ref 8.4–10.5)
Chloride: 106 mEq/L (ref 96–112)
Creatinine, Ser: 1.18 mg/dL (ref 0.50–1.35)
GFR calc Af Amer: >90 mL/min (ref >90)
GFR calc non Af Amer: 88 mL/min — ABNORMAL LOW (ref >90)
Total Bilirubin: 0.5 mg/dL (ref 0.3–1.2)

## 2011-09-06 LAB — ETHANOL: Alcohol, Ethyl (B): 25 mg/dL — ABNORMAL HIGH (ref 0–11)

## 2011-09-06 MED ORDER — ACETAMINOPHEN 325 MG PO TABS
650.0000 mg | ORAL_TABLET | ORAL | Status: DC | PRN
  Administered 2011-09-06: 650 mg via ORAL
  Filled 2011-09-06 (×2): qty 2

## 2011-09-06 MED ORDER — CEPHALEXIN 250 MG PO CAPS
500.0000 mg | ORAL_CAPSULE | Freq: Four times a day (QID) | ORAL | Status: DC
  Administered 2011-09-06 – 2011-09-07 (×6): 500 mg via ORAL
  Filled 2011-09-06 (×7): qty 2

## 2011-09-06 NOTE — BHH Counselor (Addendum)
Patient declined at [facility] by [facility]. Reason for decline is that patient has utilized and maxed out the therapuetic benefits that [facility] is able to provide for him. Writer will initiate the [facility] process and place patient their wait list for inpatient hospitalization  [facility] phone referral completed. The referral was completed with Brett Canales at [facility].  Contacted [facility] and spoke to [place] licensed clinician to request an authorization for [facility]. Authorization number is [id number] and patient is approved for 7 days. All labs, clinicals, assessments, phys doc, etc were faxed to [facility] for their staff to review. Patient is currently awaiting acceptance to the wait list at this time.

## 2011-09-06 NOTE — BH Assessment (Signed)
BHH Assessment Progress Note      Tried to assess patient, but he would not wake up.  Will continue to follow.

## 2011-09-06 NOTE — ED Notes (Signed)
Bilateral wrist redressed patient tolerated well.

## 2011-09-06 NOTE — ED Notes (Signed)
ACT team aware of pt 

## 2011-09-06 NOTE — ED Notes (Signed)
The pt is unable to void.

## 2011-09-06 NOTE — ED Notes (Signed)
Patient complains of discomfort in the left lower leg for several weeks. He does not rate pain on states it hurts "really bad". Dr. Denton Lank in to see patient in for his morning assessment, and he did assess leg. His leg is red and warm to touch.

## 2011-09-06 NOTE — ED Notes (Signed)
Pt wanded by security. House coverage called regarding need for sitter.

## 2011-09-06 NOTE — ED Provider Notes (Signed)
Pt awaiting act eval/placement. Pt states in past 1-2 months had noted soreness to medial aspect left lower leg above ankle. Dull pain worse w palpation. States in past day has noted skin sore, erythematous. Denies prior same symptoms. Denies injury.  No calf pain. No lower leg swelling. Is localized to area skin anteromedial aspect left lower leg above ankle. No ankle or malleolar pain. No ankle swelling. Dp/pt. Skin erythematous, warm, tender. ?mild/early cellulitis. xrays neg. Will rx keflex.   Suzi Roots, MD 09/06/11 [id number]

## 2011-09-06 NOTE — ED Notes (Signed)
Report to Zach Forest RN and Nyoka Key RN 

## 2011-09-06 NOTE — ED Notes (Signed)
Pt returned from xray. Pt refusing to answer questions at this time. Bilateral wrists dressed. Sitter at bedside, pt in paper scrubs.

## 2011-09-06 NOTE — ED Provider Notes (Signed)
History     CSN: [id number]  Arrival date & time 09/06/11  0023   None     Chief Complaint  Patient presents with  . Suicidal    (Consider location/radiation/quality/duration/timing/severity/associated sxs/prior treatment) HPI Comments: Patient long-standing psychiatric disorder has been without his medications for at least 3 months with a recently released from jail after a 45 day sentence.  Tonight.  He was concerned about his sister walking to the hospital, when he became upset with an associated and cut himself on both forearms and superficially.  He states he is suicidal or white to hurt people that it hurt him He also states, that his left ankle.  Has been sore for several months, with slight erythema to the medial aspect of the distal shin.  This occurred while he was in prison.  He mentioned it several times there, but no interventions were started  The history is provided by the patient.    Past Medical History  Diagnosis Date  . Seizures   . Drug abuse   . Suicidal behavior   . Borderline personality disorder     per pt  . PTSD (post-traumatic stress disorder)     per pt  . ADHD (attention deficit hyperactivity disorder)     per pt  . Paranoid schizophrenia     per pt  . Asthma     Past Surgical History  Procedure Date  . Leg surgery     No family history on file.  History  Substance Use Topics  . Smoking status: Current Everyday Smoker  . Smokeless tobacco: Not on file  . Alcohol Use: Yes      Review of Systems  Constitutional: Negative for fever and chills.  Skin: Positive for wound.  Psychiatric/Behavioral: Positive for suicidal ideas.    Allergies  Haldol; Effexor; Gabapentin; and Penicillins  Home Medications  No current outpatient prescriptions on file.  BP 120/67  Pulse 107  Temp 98.3 F (36.8 C) (Oral)  Resp 14  SpO2 98%  Physical Exam  Constitutional: He appears well-developed and well-nourished.  HENT:  Head:  Normocephalic.  Eyes: Pupils are equal, round, and reactive to light.  Neck: Normal range of motion.  Cardiovascular: Normal rate.   Pulmonary/Chest: Effort normal.  Musculoskeletal: Normal range of motion. He exhibits no tenderness.  Neurological: He is alert.  Skin: Skin is warm. No erythema.       Superficial abrasions to bilateral inner forearms.  That have been cleaned and dressed with Neosporin    ED Course  Procedures (including critical care time)   Labs Reviewed  URINALYSIS, ROUTINE W REFLEX MICROSCOPIC  URINE RAPID DRUG SCREEN (HOSP PERFORMED)  CBC WITH DIFFERENTIAL  COMPREHENSIVE METABOLIC PANEL  ETHANOL   No results found.   No diagnosis found.    MDM   You should still tetanus status is up-to-date.  I will x-ray his shin to address his concerns about "a tumor" in his ankle.  His superficial abrasions have been dressed and will not need antibiotics.  I have asked that this patient be moved to our evaluation unit and will consult ACT for further evaluation        Arman Filter, NP 09/06/11 0149

## 2011-09-06 NOTE — ED Notes (Signed)
The pt attempted to kill himself by cutting his wrists earlier.  And he has a a  Swelling on his lt ankle that he wants looked at

## 2011-09-06 NOTE — ED Provider Notes (Signed)
Medical screening examination/treatment/procedure(s) were performed by non-physician practitioner and as supervising physician I was immediately available for consultation/collaboration.   Chessa Barrasso, MD 09/06/11 0600 

## 2011-09-06 NOTE — ED Notes (Addendum)
Patient transported to X-ray with sitter 

## 2011-09-06 NOTE — BH Assessment (Signed)
BHH Assessment Progress Note      Per Jacquelyne Balint, RN, [facility], pt requires long term treatment, and his acuity is too great for [facility] to provide appropriate care at this time.  This writer called Melynda Ripple, ACT Team to notify her at 09: 13.  Doylene Canning, MA Assessment Counselor

## 2011-09-06 NOTE — ED Notes (Signed)
Pt given Sprite per request. No further requests at this time. Sitter remains at bedside.

## 2011-09-06 NOTE — BH Assessment (Signed)
BHH Assessment Progress Note      Attempted to assess pt, but he was not responsive.  Sitter reports he has been "out of it" and not responding when staff attempted to get him to give a urine sample, etc.  Will continue to follow.

## 2011-09-06 NOTE — ED Notes (Signed)
The pt says he has had pot today

## 2011-09-06 NOTE — BH Assessment (Signed)
Assessment Note   [name] [name] is an 21 y.o. male who came to [facility] after attempting to end his life by cutting his wrists and attempting to stab himself in the back of the head (only resulting in superficial lacerations).  He reports he was released from jail yesterday (for Breaking adn entering a vehicle) and was unable to locate his sister, which set him off.  He reports taking Prozac, lithium, wellbutrin, ambien, zoloft, depakote, and thorazine, but not having any of his meds while in jail for the last 40 days or so.  He states these meds were prescribed at his last hospitalization.  He also admits to some ocassional auditory hallucinations telling him to harm himself and bad things about himself.  He told the nurse practitioner he was having homicidal ideation, but denied it up on assessment although he admits he often has thoughts of harming others, but is not a violent person and is not currently having any of these thoughts.  Pt information faxed to [facility] for review for inpatient admission.  Axis I: Depressive Disorder NOS Axis II: Deferred Axis III:  Past Medical History  Diagnosis Date  . Seizures   . Drug abuse   . Suicidal behavior   . Borderline personality disorder     per pt  . PTSD (post-traumatic stress disorder)     per pt  . ADHD (attention deficit hyperactivity disorder)     per pt  . Paranoid schizophrenia     per pt  . Asthma    Axis IV: economic problems, problems related to legal system/crime and problems with primary support group Axis V: 31-40 impairment in reality testing  Past Medical History:  Past Medical History  Diagnosis Date  . Seizures   . Drug abuse   . Suicidal behavior   . Borderline personality disorder     per pt  . PTSD (post-traumatic stress disorder)     per pt  . ADHD (attention deficit hyperactivity disorder)     per pt  . Paranoid schizophrenia     per pt  . Asthma     Past Surgical History  Procedure Date  . Leg surgery      Family History: No family history on file.  Social History:  reports that he has been smoking.  He does not have any smokeless tobacco history on file. He reports that he drinks alcohol. He reports that he uses illicit drugs (Marijuana).  Additional Social History:     CIWA: CIWA-Ar BP: 120/67 mmHg Pulse Rate: 107  COWS:    Allergies:  Allergies  Allergen Reactions  . Haldol (Haloperidol Lactate) Other (See Comments)    Seizures   . Effexor (Venlafaxine Hydrochloride) Other (See Comments)    "bad reaction"  . Gabapentin Other (See Comments)    "bad reaction"  . Penicillins Swelling    Home Medications:  (Not in a hospital admission)  OB/GYN Status:  No LMP for male patient.  General Assessment Data Location of Assessment: [facility] ED Living Arrangements: Alone Can pt return to current living arrangement?: Yes Admission Status: Voluntary Is patient capable of signing voluntary admission?: Yes Transfer from: Acute Hospital Referral Source: Self/Family/Friend  Education Status Is patient currently in school?: No Highest grade of school patient has completed: 9  Risk to self Suicidal Ideation: Yes-Currently Present Suicidal Intent: Yes-Currently Present Is patient at risk for suicide?: Yes Suicidal Plan?: Yes-Currently Present Specify Current Suicidal Plan: cut wrists, stab self in head Access to Means:  Yes Specify Access to Suicidal Means: sharpts What has been your use of drugs/alcohol within the last 12 months?: was drinking daily before jail Previous Attempts/Gestures: Yes How many times?:  (several) Triggers for Past Attempts: Other (Comment) (impulsive) Intentional Self Injurious Behavior: None Family Suicide History: No Recent stressful life event(s): Legal Issues;Loss (Comment) (couldnt' find sister) Persecutory voices/beliefs?: Yes Depression: Yes Depression Symptoms: Despondent;Tearfulness;Isolating;Fatigue;Guilt;Loss of interest in usual  pleasures;Feeling worthless/self pity;Feeling angry/irritable Substance abuse history and/or treatment for substance abuse?: No Suicide prevention information given to non-admitted patients: Not applicable  Risk to Others Homicidal Ideation: No-Not Currently/Within Last 6 Months Thoughts of Harm to Others: No-Not Currently Present/Within Last 6 Months Current Homicidal Intent: No-Not Currently/Within Last 6 Months Current Homicidal Plan: No-Not Currently/Within Last 6 Months Access to Homicidal Means: No Identified Victim: various History of harm to others?: No Assessment of Violence: None Noted Violent Behavior Description: no history, reports thoughts toward others-enjoys thinking of hurting others Does patient have access to weapons?: No Criminal Charges Pending?: No Does patient have a court date: No  Psychosis Hallucinations: Auditory;With command (voices telling him bad things about himself, to harm self) Delusions: None noted  Mental Status Report Appear/Hygiene: Other (Comment) (unremarkable) Eye Contact: Fair Motor Activity: Freedom of movement Speech: Logical/coherent Level of Consciousness: Alert Mood: Depressed Affect: Appropriate to circumstance Anxiety Level: None Thought Processes: Coherent;Relevant Judgement: Impaired Orientation: Person;Place;Time;Situation Obsessive Compulsive Thoughts/Behaviors: Moderate  Cognitive Functioning Concentration: Decreased Memory: Recent Intact;Remote Intact IQ: Average Insight: Poor Impulse Control: Fair Appetite: Good Sleep: Increased Vegetative Symptoms: None  ADLScreening [facility] Assessment Services) Patient's cognitive ability adequate to safely complete daily activities?: Yes Patient able to express need for assistance with ADLs?: Yes Independently performs ADLs?: Yes  Abuse/Neglect [facility]) Physical Abuse: Denies Verbal Abuse: Denies Sexual Abuse: Denies  Prior Inpatient Therapy Prior Inpatient Therapy:  Yes Prior Therapy Dates: 2013, cant' remember Prior Therapy Facilty/Provider(s): [place], [facility], [facility] Reason for Treatment: Depression, AVH  Prior Outpatient Therapy Prior Outpatient Therapy: No  ADL Screening (condition at time of admission) Patient's cognitive ability adequate to safely complete daily activities?: Yes Patient able to express need for assistance with ADLs?: Yes Independently performs ADLs?: Yes       Abuse/Neglect Assessment (Assessment to be complete while patient is alone) Physical Abuse: Denies Verbal Abuse: Denies Sexual Abuse: Denies Exploitation of patient/patient's resources: Denies Self-Neglect: Denies     Merchant navy officer (For Healthcare) Advance Directive: Patient does not have advance directive Nutrition Screen Diet: Regular Unintentional weight loss greater than 10lbs within the last month: No Problems chewing or swallowing foods and/or liquids: No Home Tube Feeding or Total Parenteral Nutrition (TPN): No Patient appears severely malnourished: No  Additional Information 1:1 In Past 12 Months?: No CIRT Risk: No Elopement Risk: No Does patient have medical clearance?: Yes     Disposition:  Disposition Disposition of Patient: Inpatient treatment program Type of inpatient treatment program: Adult (pt being run at [facility])  On Site Evaluation by:  Sabino Dick, NP Reviewed with Physician:  Sabino Dick, NP   Lissa Hoard Marlana Latus 09/06/2011 6:50 AM

## 2011-09-08 MED ORDER — CEPHALEXIN 250 MG PO CAPS: 250.0000 mg | ORAL_CAPSULE | Freq: Four times a day (QID) | ORAL | Status: AC

## 2011-09-08 NOTE — ED Notes (Signed)
Waiting for social worker and case manager to see patient.

## 2011-09-08 NOTE — ED Notes (Signed)
CSW was asked to meet with pt to give resources for homelessness. Pt pretended to not know his name when CSW entered room. Pt denies being homeless, states that he is living with his mother. Pt joking, responding with sarcasm and looking to sitter for laughter. CSW advised Pt to go to the [facility] for assistance in finding a job and building his resume. Pt stated that he has "horrible criminal history" and can't find a job. Pt was laughing about his hx of charges. CSW encouraged Pt to follow up with [facility] and [facility], to request a case manager at both locations for additional support.    Frederico Hamman, [place] [phone]  ED Clinical Social Worker

## 2011-09-08 NOTE — ED Notes (Signed)
Patient given his belongings and security taking patient to get belongings from locker in security and then they will walk patient to discharge.

## 2011-09-08 NOTE — ED Notes (Signed)
Social Worker at bedside.

## 2011-09-08 NOTE — BH Assessment (Signed)
Assessment Note   [name] [name] is an 21 y.o. male that was reassed by Clinical research associate and telepsychiatrist this day.  Pt currently denies SI/HI or psychosis.  Per ED Effie Shy and telepsychiatrist, pt can be discharged home and given outpatient referrals.  Per EDP Effie Shy, pt was also referred to social work and care management for additional resourdes.  Pt signed  no-harm and no-violence contracts.  Pt stated he was going to go home and live with his mother as well as follow up at [facility] to get his prescribed psychotropic medications.  Completed reassessment, assessment notification and faxed to [facility] to log.  Pt given outpatient referrals and is to be discharged home.  Updated ED staff.  Called [facility] to inform pt discharged, as he was on wait list there.  Previous Notes:  Patient declined at [facility] by [facility]. Reason for decline is that patient has utilized and maxed out the therapuetic benefits that [facility] is able to provide for him. Writer will initiate the [facility] process and place patient their wait list for inpatient hospitalization  [facility] phone referral completed. The referral was completed with Brett Canales at [facility]. Contacted [facility] and spoke to [place] licensed clinician to request an authorization for [facility]. Authorization number is [id number] and patient is approved for 7 days. All labs, clinicals, assessments, phys doc, etc were faxed to [facility] for their staff to review. Patient is currently awaiting acceptance to the wait list at this time.    [name] [name] is an 21 y.o. male who came to [facility] after attempting to end his life by cutting his wrists and attempting to stab himself in the back of the head (only resulting in superficial lacerations). He reports he was released from jail yesterday (for Breaking adn entering a vehicle) and was unable to locate his sister, which set him off. He reports taking Prozac, lithium, wellbutrin, ambien, zoloft, depakote, and thorazine, but not having any of his meds while in jail for the  last 40 days or so. He states these meds were prescribed at his last hospitalization. He also admits to some ocassional auditory hallucinations telling him to harm himself and bad things about himself. He told the nurse practitioner he was having homicidal ideation, but denied it up on assessment although he admits he often has thoughts of harming others, but is not a violent person and is not currently having any of these thoughts. Pt information faxed to [facility] for review for inpatient admission.   Axis I: Depressive Disorder NOS and Substance Abuse Axis II: Deferred Axis III:  Past Medical History  Diagnosis Date  . Seizures   . Drug abuse   . Suicidal behavior   . Borderline personality disorder     per pt  . PTSD (post-traumatic stress disorder)     per pt  . ADHD (attention deficit hyperactivity disorder)     per pt  . Paranoid schizophrenia     per pt  . Asthma    Axis IV: other psychosocial or environmental problems, problems related to legal system/crime, problems related to social environment and problems with primary support group Axis V: 41-50 serious symptoms  Past Medical History:  Past Medical History  Diagnosis Date  . Seizures   . Drug abuse   . Suicidal behavior   . Borderline personality disorder     per pt  . PTSD (post-traumatic stress disorder)     per pt  . ADHD (attention deficit hyperactivity disorder)     per  pt  . Paranoid schizophrenia     per pt  . Asthma     Past Surgical History  Procedure Date  . Leg surgery     Family History: No family history on file.  Social History:  reports that he has been smoking.  He does not have any smokeless tobacco history on file. He reports that he drinks alcohol. He reports that he uses illicit drugs (Marijuana).  Additional Social History:     CIWA: CIWA-Ar BP: 113/68 mmHg Pulse Rate: 61  COWS:    Allergies:  Allergies  Allergen Reactions  . Haldol (Haloperidol Lactate) Other (See Comments)     Seizures   . Effexor (Venlafaxine Hydrochloride) Other (See Comments)    "bad reaction"  . Gabapentin Other (See Comments)    "bad reaction"  . Penicillins Swelling    Home Medications:  (Not in a hospital admission)  OB/GYN Status:  No LMP for male patient.  General Assessment Data Location of Assessment: [facility] ED Living Arrangements: Alone Can pt return to current living arrangement?: Yes Admission Status: Voluntary Is patient capable of signing voluntary admission?: Yes Transfer from: Acute Hospital Referral Source: Self/Family/Friend  Education Status Is patient currently in school?: No Highest grade of school patient has completed: 9  Risk to self Suicidal Ideation: No-Not Currently/Within Last 6 Months Suicidal Intent: No-Not Currently/Within Last 6 Months Is patient at risk for suicide?: No Suicidal Plan?: No-Not Currently/Within Last 6 Months Specify Current Suicidal Plan: did have plan to cut wrists adn stab self in head Access to Means: No Specify Access to Suicidal Means: na What has been your use of drugs/alcohol within the last 12 months?: used ETOH daily before going to jail Previous Attempts/Gestures: Yes How many times?:  (several) Other Self Harm Risks: pt denies Triggers for Past Attempts: Other (Comment) (impulsive) Intentional Self Injurious Behavior: Cutting (superficial cuts to wrists) Comment - Self Injurious Behavior: na Family Suicide History: No Recent stressful life event(s): Loss (Comment);Legal Issues (couldn't find sister) Persecutory voices/beliefs?: No Depression: Yes Depression Symptoms: Despondent;Insomnia;Isolating;Loss of interest in usual pleasures;Feeling worthless/self pity;Feeling angry/irritable Substance abuse history and/or treatment for substance abuse?: Yes Suicide prevention information given to non-admitted patients: Yes  Risk to Others Homicidal Ideation: No-Not Currently/Within Last 6 Months Thoughts of Harm to Others:  No-Not Currently Present/Within Last 6 Months Current Homicidal Intent: No-Not Currently/Within Last 6 Months Current Homicidal Plan: No-Not Currently/Within Last 6 Months Access to Homicidal Means: No Identified Victim: none History of harm to others?: No Assessment of Violence: None Noted Violent Behavior Description: na - pt calm, cooperative Does patient have access to weapons?: No Criminal Charges Pending?: No Does patient have a court date: No  Psychosis Hallucinations: None noted Delusions: None noted  Mental Status Report Appear/Hygiene: Other (Comment) (casual) Eye Contact: Fair Motor Activity: Unremarkable Speech: Logical/coherent Level of Consciousness: Alert Mood: Depressed Affect: Appropriate to circumstance Anxiety Level: None Thought Processes: Coherent;Relevant Judgement: Impaired Orientation: Person;Place;Time;Situation Obsessive Compulsive Thoughts/Behaviors: None  Cognitive Functioning Concentration: Decreased Memory: Recent Intact;Remote Intact IQ: Average Insight: Fair Impulse Control: Fair Appetite: Good Weight Loss: 0  Weight Gain: 0  Sleep: Increased Total Hours of Sleep:  (varies) Vegetative Symptoms: None  ADLScreening [facility] Assessment Services) Patient's cognitive ability adequate to safely complete daily activities?: Yes Patient able to express need for assistance with ADLs?: Yes Independently performs ADLs?: Yes  Abuse/Neglect [facility]) Physical Abuse: Denies Verbal Abuse: Denies Sexual Abuse: Denies  Prior Inpatient Therapy Prior Inpatient Therapy: Yes Prior Therapy Dates: 2013, cant'  remember Prior Therapy Facilty/Provider(s): [place], [facility], [place] Reason for Treatment: Depression, AVH  Prior Outpatient Therapy Prior Outpatient Therapy: No Prior Therapy Dates: na Prior Therapy Facilty/Provider(s): na Reason for Treatment: na  ADL Screening (condition at time of admission) Patient's cognitive ability adequate to safely  complete daily activities?: Yes Patient able to express need for assistance with ADLs?: Yes Independently performs ADLs?: Yes       Abuse/Neglect Assessment (Assessment to be complete while patient is alone) Physical Abuse: Denies Verbal Abuse: Denies Sexual Abuse: Denies Exploitation of patient/patient's resources: Denies Self-Neglect: Denies Values / Beliefs Cultural Requests During Hospitalization: None Spiritual Requests During Hospitalization: None   Advance Directives (For Healthcare) Advance Directive: Patient does not have advance directive Nutrition Screen Diet: Regular Unintentional weight loss greater than 10lbs within the last month: No Problems chewing or swallowing foods and/or liquids: No Home Tube Feeding or Total Parenteral Nutrition (TPN): No Patient appears severely malnourished: No  Additional Information 1:1 In Past 12 Months?: No CIRT Risk: No Elopement Risk: No Does patient have medical clearance?: Yes     Disposition:  Disposition Disposition of Patient: Referred to;Outpatient treatment Type of inpatient treatment program: Adult (pt being run at [facility]) Type of outpatient treatment: Adult Patient referred to: Outpatient clinic referral  On Site Evaluation by:   Reviewed with Physician:  Tressia Danas, Rennis Harding 09/08/2011 12:00 PM

## 2011-09-08 NOTE — ED Provider Notes (Signed)
The patient is calm and comfortable.  He is anxious to leave and cooperative.  The area on his left lower leg is nontender.  He was started on Keflex for possible cellulitis of the area above the medial left ankle.  At this time.  Redness, swelling, deformity, or streaking.  There is no popliteal tenderness or swelling.  His cellulitis appears to have resolved.  He is pending a tele-psychiatric consultation may be able to be discharged home.  The patient was seen by tele-psychiatry.  He was deemed stable for discharge to home.  He is additionally advised to followup with a Child psychotherapist, a Sports coach, an alcohol and drug treatment program and community mental health.   Plan: Continue usual meds. Rx Keflex for cellulitis. F/U PCP of choice asap.   Flint Melter, MD 09/08/11 Zollie Pee

## 2011-09-08 NOTE — Progress Notes (Signed)
Received consult from Dr. Effie Shy to assist patient with case management after discharge. Explained the difference in functions and referred this patient to [facility]. Dr. Effie Shy was agreeable with this.

## 2012-06-15 ENCOUNTER — Emergency Department (HOSPITAL_COMMUNITY)
Admission: EM | Admit: 2012-06-15 | Discharge: 2012-06-15 | Disposition: A | Discharge status: Home / Self Care | Payer: Self-pay | Attending: Emergency Medicine | Admitting: Emergency Medicine

## 2012-06-15 ENCOUNTER — Encounter (HOSPITAL_COMMUNITY): Payer: Self-pay | Admitting: Emergency Medicine

## 2012-06-15 ENCOUNTER — Emergency Department (HOSPITAL_COMMUNITY): Payer: Self-pay

## 2012-06-15 DIAGNOSIS — Y9289 Other specified places as the place of occurrence of the external cause: Secondary | ICD-10-CM | POA: Insufficient documentation

## 2012-06-15 DIAGNOSIS — R0789 Other chest pain: Secondary | ICD-10-CM

## 2012-06-15 DIAGNOSIS — S298XXA Other specified injuries of thorax, initial encounter: Secondary | ICD-10-CM | POA: Insufficient documentation

## 2012-06-15 DIAGNOSIS — Y9301 Activity, walking, marching and hiking: Secondary | ICD-10-CM | POA: Insufficient documentation

## 2012-06-15 DIAGNOSIS — Z880 Allergy status to penicillin: Secondary | ICD-10-CM | POA: Insufficient documentation

## 2012-06-15 DIAGNOSIS — Z8659 Personal history of other mental and behavioral disorders: Secondary | ICD-10-CM | POA: Insufficient documentation

## 2012-06-15 DIAGNOSIS — R296 Repeated falls: Secondary | ICD-10-CM | POA: Insufficient documentation

## 2012-06-15 DIAGNOSIS — F17200 Nicotine dependence, unspecified, uncomplicated: Secondary | ICD-10-CM | POA: Insufficient documentation

## 2012-06-15 LAB — COMPREHENSIVE METABOLIC PANEL
Alkaline Phosphatase: 80 U/L (ref 39–117)
BUN: 19 mg/dL (ref 6–23)
Creatinine, Ser: 0.93 mg/dL (ref 0.50–1.35)
GFR calc Af Amer: >90 mL/min (ref >90)
Glucose, Bld: 77 mg/dL (ref 70–99)
Potassium: 4 mEq/L (ref 3.5–5.1)
Total Bilirubin: 0.5 mg/dL (ref 0.3–1.2)
Total Protein: 7.4 g/dL (ref 6.0–8.3)

## 2012-06-15 LAB — ACETAMINOPHEN LEVEL: Acetaminophen (Tylenol), Serum: <15 ug/mL (ref 10–30)

## 2012-06-15 LAB — CBC WITH DIFFERENTIAL/PLATELET
Eosinophils Absolute: 0.1 10*3/uL (ref 0.0–0.7)
HCT: 41.2 % (ref 39.0–52.0)
Hemoglobin: 14.1 g/dL (ref 13.0–17.0)
Lymphs Abs: 2.2 10*3/uL (ref 0.7–4.0)
MCH: 31.7 pg (ref 26.0–34.0)
MCV: 92.6 fL (ref 78.0–100.0)
Monocytes Absolute: 1.4 10*3/uL — ABNORMAL HIGH (ref 0.1–1.0)
Monocytes Relative: 10 % (ref 3–12)
Neutrophils Relative %: 72 % (ref 43–77)
RBC: 4.45 MIL/uL (ref 4.22–5.81)

## 2012-06-15 LAB — RAPID URINE DRUG SCREEN, HOSP PERFORMED
Amphetamines: NOT DETECTED
Cocaine: NOT DETECTED
Opiates: NOT DETECTED

## 2012-06-15 LAB — SALICYLATE LEVEL: Salicylate Lvl: <2 mg/dL — ABNORMAL LOW (ref 2.8–20.0)

## 2012-06-15 NOTE — ED Notes (Signed)
Pt changed into [name] [name] scrubs. Pt has 2 belonging bags. Security has been called to wand the pt

## 2012-06-15 NOTE — ED Notes (Signed)
Pt has been wanded by security. Pt belongings are at nursing station

## 2012-06-15 NOTE — ED Provider Notes (Signed)
History     CSN: [id number]  Arrival date & time 06/15/12  0406   First MD Initiated Contact with Patient 06/15/12 [phone]      Chief Complaint  Patient presents with  . Fall  . Medical Clearance    (Consider location/radiation/quality/duration/timing/severity/associated sxs/prior treatment) HPI  22 y.o. Male walking down the street last night states began having right sided pain and fell to ground.  States ems came and transported here.  Continues to have diffuse pain "all over body".  Increases with movement or palpation.  No fever, chills, nausea, vomiting, headache or head injury.  Denies being assaulted.  Denies alcohol or drugs.  Denies si, hi, or psychosis.    Past Medical History  Diagnosis Date  . Bipolar affect, depressed   . Depression   . Suicidal behavior     History reviewed. No pertinent past surgical history.  No family history on file.  History  Substance Use Topics  . Smoking status: Current Every Day Smoker -- 0.50 packs/day    Types: Cigarettes  . Smokeless tobacco: Never Used  . Alcohol Use: No      Review of Systems  All other systems reviewed and are negative.    Allergies  Effexor; Haldol; Neurontin; and Penicillins  Home Medications  No current outpatient prescriptions on file.  BP 130/64  Pulse 73  Temp(Src) 97.6 F (36.4 C) (Oral)  Resp 16  SpO2 99%  Physical Exam  Nursing note and vitals reviewed. Constitutional: He is oriented to person, place, and time. He appears well-developed and well-nourished.  HENT:  Head: Normocephalic and atraumatic.  Right Ear: External ear normal.  Left Ear: External ear normal.  Nose: Nose normal.  Mouth/Throat: Oropharynx is clear and moist.  Eyes: Conjunctivae and EOM are normal. Pupils are equal, round, and reactive to light.  Neck: Normal range of motion. Neck supple.  Cardiovascular: Normal rate, regular rhythm, normal heart sounds and intact distal pulses.   Pulmonary/Chest: Effort  normal and breath sounds normal. No respiratory distress. He has no wheezes. He exhibits tenderness.  ttp on chest right greater than left  Abdominal: Soft. Bowel sounds are normal. He exhibits no distension and no mass. There is no tenderness. There is no guarding.  Musculoskeletal: Normal range of motion.  Neurological: He is alert and oriented to person, place, and time. He has normal reflexes. He exhibits normal muscle tone. Coordination normal.  Skin: Skin is warm and dry.  ttp all over body  Psychiatric: He has a normal mood and affect. His behavior is normal. Judgment and thought content normal.    ED Course  Procedures (including critical care time)  Labs Reviewed  CBC WITH DIFFERENTIAL - Abnormal; Notable for the following:    WBC 13.5 (*)    Neutro Abs 9.8 (*)    Monocytes Absolute 1.4 (*)    All other components within normal limits  URINE RAPID DRUG SCREEN (HOSP PERFORMED) - Abnormal; Notable for the following:    Tetrahydrocannabinol POSITIVE (*)    All other components within normal limits  SALICYLATE LEVEL - Abnormal; Notable for the following:    Salicylate Lvl <2.0 (*)    All other components within normal limits  COMPREHENSIVE METABOLIC PANEL  ETHANOL  ACETAMINOPHEN LEVEL  TROPONIN I   Dg Chest Port 1 View  06/15/2012  *RADIOLOGY REPORT*  Clinical Data: Medical clearance.  PORTABLE CHEST - 1 VIEW  Comparison: None.  Findings: Minimal peribronchial thickening. This may be normal or reactive in  origin with no other findings to suggest bronchitis. No infiltrate, congestive heart failure or pneumothorax.  Heart size top normal.  Bony structures intact.  IMPRESSION: Minimal peribronchial thickening.   Original Report Authenticated By: Lacy Duverney, M.D.      No diagnosis found.    Patient states knees hurt after fell-diffuse tenderness, no abrasion noted.  Patient ambulatory.  Denies si, hi, or pscyhosis.         Hilario Quarry, MD 06/15/12 [phone]

## 2012-06-15 NOTE — ED Notes (Signed)
Per EMS - Pt called EMS, reported he was walking home from friend's house, felt cold so started running, felt weak, sat/collapsed/fell to the ground. Called EMS ":body felt weird", c/o left knee pain, no obvious injury or signs of trauma. En route immediately PTA c/o right sided chest wall pain tender to palpation. Denies drugs and or ETOH consumption. Just released from prison and on multiple psych meds which have not been continued. BP - 119/70, HR - 94, Resp - 16. Endorses Hx of seizures but no trauma, incontinence or other indications of seizure.

## 2012-06-15 NOTE — ED Notes (Signed)
RUE:[id number]<JW> Expected date:06/15/12<BR> Expected time: 3:54 AM<BR> Means of arrival:Ambulance<BR> Comments:<BR> Fall/weak

## 2012-08-05 ENCOUNTER — Emergency Department (HOSPITAL_COMMUNITY)
Admission: EM | Admit: 2012-08-05 | Discharge: 2012-08-06 | Disposition: A | Discharge status: Home / Self Care | Payer: Self-pay | Attending: Emergency Medicine | Admitting: Emergency Medicine

## 2012-08-05 ENCOUNTER — Encounter (HOSPITAL_COMMUNITY): Payer: Self-pay | Admitting: *Deleted

## 2012-08-05 ENCOUNTER — Emergency Department (HOSPITAL_COMMUNITY): Payer: Self-pay

## 2012-08-05 DIAGNOSIS — Z880 Allergy status to penicillin: Secondary | ICD-10-CM | POA: Insufficient documentation

## 2012-08-05 DIAGNOSIS — J40 Bronchitis, not specified as acute or chronic: Secondary | ICD-10-CM

## 2012-08-05 DIAGNOSIS — Z8669 Personal history of other diseases of the nervous system and sense organs: Secondary | ICD-10-CM | POA: Insufficient documentation

## 2012-08-05 DIAGNOSIS — R059 Cough, unspecified: Secondary | ICD-10-CM | POA: Insufficient documentation

## 2012-08-05 DIAGNOSIS — R509 Fever, unspecified: Secondary | ICD-10-CM | POA: Insufficient documentation

## 2012-08-05 DIAGNOSIS — J45909 Unspecified asthma, uncomplicated: Secondary | ICD-10-CM | POA: Insufficient documentation

## 2012-08-05 DIAGNOSIS — R05 Cough: Secondary | ICD-10-CM | POA: Insufficient documentation

## 2012-08-05 DIAGNOSIS — R0789 Other chest pain: Secondary | ICD-10-CM | POA: Insufficient documentation

## 2012-08-05 DIAGNOSIS — F1910 Other psychoactive substance abuse, uncomplicated: Secondary | ICD-10-CM | POA: Insufficient documentation

## 2012-08-05 DIAGNOSIS — R042 Hemoptysis: Secondary | ICD-10-CM | POA: Insufficient documentation

## 2012-08-05 DIAGNOSIS — Z8659 Personal history of other mental and behavioral disorders: Secondary | ICD-10-CM | POA: Insufficient documentation

## 2012-08-05 DIAGNOSIS — K920 Hematemesis: Secondary | ICD-10-CM | POA: Insufficient documentation

## 2012-08-05 DIAGNOSIS — F17200 Nicotine dependence, unspecified, uncomplicated: Secondary | ICD-10-CM | POA: Insufficient documentation

## 2012-08-05 DIAGNOSIS — J209 Acute bronchitis, unspecified: Secondary | ICD-10-CM | POA: Insufficient documentation

## 2012-08-05 DIAGNOSIS — R45851 Suicidal ideations: Secondary | ICD-10-CM | POA: Insufficient documentation

## 2012-08-05 DIAGNOSIS — R0602 Shortness of breath: Secondary | ICD-10-CM | POA: Insufficient documentation

## 2012-08-05 NOTE — ED Notes (Signed)
Pt states that he has been coughing for several days but he has been coughing up blood for the past 2 days. Pt states that he also vomited blood earlier today. Pt worried about a bleeding ulcer. Pt did have bright red blood after coughing in triage. Pt has generalized chest pain as well.  Pt alert and oriented.

## 2012-08-06 LAB — CBC WITH DIFFERENTIAL/PLATELET
Eosinophils Absolute: 0.2 10*3/uL (ref 0.0–0.7)
Lymphs Abs: 2.4 10*3/uL (ref 0.7–4.0)
MCH: 32 pg (ref 26.0–34.0)
Neutro Abs: 16.4 10*3/uL — ABNORMAL HIGH (ref 1.7–7.7)
Neutrophils Relative %: 79 % — ABNORMAL HIGH (ref 43–77)
Platelets: 171 10*3/uL (ref 150–400)
RBC: 4.1 MIL/uL — ABNORMAL LOW (ref 4.22–5.81)
WBC: 20.7 10*3/uL — ABNORMAL HIGH (ref 4.0–10.5)

## 2012-08-06 LAB — BASIC METABOLIC PANEL
Calcium: 9.3 mg/dL (ref 8.4–10.5)
GFR calc Af Amer: >90 mL/min (ref >90)
GFR calc non Af Amer: >90 mL/min (ref >90)
Potassium: 3.5 mEq/L (ref 3.5–5.1)
Sodium: 137 mEq/L (ref 135–145)

## 2012-08-06 LAB — D-DIMER, QUANTITATIVE: D-Dimer, Quant: <0.27 ug/mL-FEU (ref 0.00–0.48)

## 2012-08-06 MED ORDER — DOXYCYCLINE HYCLATE 100 MG PO CAPS: 100.0000 mg | ORAL_CAPSULE | Freq: Two times a day (BID) | ORAL | Status: AC

## 2012-08-06 NOTE — ED Notes (Signed)
MD at bedside. 

## 2012-08-06 NOTE — ED Provider Notes (Signed)
History     CSN: [id number]  Arrival date & time 08/05/12  2220   First MD Initiated Contact with Patient 08/05/12 2350      Chief Complaint  Patient presents with  . Chest Pain  . Hematemesis  . Hemoptysis    Patient is a 22 y.o. male presenting with cough. The history is provided by the patient.  Cough Cough characteristics:  Productive Sputum characteristics:  Bloody Severity:  Moderate Onset quality:  Gradual Duration:  2 days Timing:  Intermittent Progression:  Worsening Chronicity:  New Smoker: yes   Relieved by:  Nothing Worsened by:  Nothing tried Associated symptoms: chest pain, chills, fever and shortness of breath   pt reports he has been sick for "weeks"  With recent fevers and cough Starting about 2 days ago he started having hemoptysis He also reports CP, mostly with cough and deep breathing He also reports SOB He reports an episode of hematemesis but was only small amt and mostly clear liquid and he denies rectal bleeding  He denies h/o PE/DVT  Past Medical History  Diagnosis Date  . Seizures   . Drug abuse   . Suicidal behavior   . Borderline personality disorder     per pt  . PTSD (post-traumatic stress disorder)     per pt  . ADHD (attention deficit hyperactivity disorder)     per pt  . Paranoid schizophrenia     per pt  . Asthma     Past Surgical History  Procedure Laterality Date  . Leg surgery      History reviewed. No pertinent family history.  History  Substance Use Topics  . Smoking status: Current Every Day Smoker  . Smokeless tobacco: Not on file  . Alcohol Use: Yes      Review of Systems  Constitutional: Positive for fever and chills.  HENT: Negative for nosebleeds.   Respiratory: Positive for cough and shortness of breath.   Cardiovascular: Positive for chest pain.  All other systems reviewed and are negative.    Allergies  Haldol; Effexor; Gabapentin; and Penicillins  Home Medications  No current outpatient  prescriptions on file.  BP 125/70  Pulse 82  Temp(Src) 99 F (37.2 C) (Oral)  Resp 18  Ht 5\' 9"  (1.753 m)  Wt 177 lb (80.287 kg)  BMI 26.13 kg/m2  SpO2 96%  Physical Exam CONSTITUTIONAL: Well developed/well nourished HEAD: Normocephalic/atraumatic EYES: EOMI/PERRL ENMT: Mucous membranes moist, no blood noted in oropharynx.  No blood noted in either nose NECK: supple no meningeal signs SPINE:entire spine nontender CV: S1/S2 noted, no murmurs/rubs/gallops noted LUNGS: Lungs are clear to auscultation bilaterally, no apparent distress ABDOMEN: soft, nontender, no rebound or guarding GU:no cva tenderness NEURO: Pt is awake/alert, moves all extremitiesx4 EXTREMITIES: pulses normal, full ROM, no edema and no calf tenderness SKIN: warm, color normal PSYCH: no abnormalities of mood noted  ED Course  Procedures   Labs Reviewed  BASIC METABOLIC PANEL  CBC WITH DIFFERENTIAL  D-DIMER, QUANTITATIVE   Dg Chest 2 View  08/05/2012   *RADIOLOGY REPORT*  Clinical Data: Chest pain.  Hemoptysis.  CHEST - 2 VIEW  Comparison: Chest x-ray 07/14/2011.  Findings: Lung volumes are normal.  No consolidative airspace disease.  No pleural effusions.  No pneumothorax.  No pulmonary nodule or mass noted.  Pulmonary vasculature and the cardiomediastinal silhouette are within normal limits.  IMPRESSION: 1. No radiographic evidence of acute cardiopulmonary disease.   Original Report Authenticated By: Trudie Reed, M.D.  12:37 AM Pt here with hemoptysis, sob and CP CXR negative Concern for PE He does report being in prison recently but had negative TB test and negative HIV and he had negative CXR, doubt Tuberculosis at this time 2:43 AM ddimer negative, doubt PE at this time No distress here, no hypoxia Will d/c home with antibiotics Pt stable for d/c   MDM  Nursing notes including past medical history and social history reviewed and considered in documentation xrays reviewed and  considered Labs/vital reviewed and considered        Date: 08/05/2012  Rate: 108  Rhythm: sinus tachycardia  QRS Axis: right  Intervals: normal  ST/T Wave abnormalities: nonspecific ST changes  Conduction Disutrbances:none  Narrative Interpretation:   Old EKG Reviewed: changes noted rate is faster on today's ekg     Joya Gaskins, MD 08/06/12 [phone]

## 2012-08-06 NOTE — ED Notes (Signed)
Pt ambulated in hallway with tech. Pt was able to walk but kept stopping and falling asleep. Pt alert and oriented. Pt states he is just hungry and tired. Pt hr in 90s. O2 sats.93% on room air. MD wickline notifed

## 2012-09-09 ENCOUNTER — Emergency Department (HOSPITAL_COMMUNITY)
Admission: EM | Admit: 2012-09-09 | Discharge: 2012-09-09 | Disposition: A | Discharge status: Other Acute Inpatient Hospital | Payer: Self-pay | Attending: Emergency Medicine | Admitting: Emergency Medicine

## 2012-09-09 ENCOUNTER — Emergency Department (HOSPITAL_COMMUNITY): Payer: Self-pay

## 2012-09-09 ENCOUNTER — Inpatient Hospital Stay (HOSPITAL_COMMUNITY)
Admission: AD | Admit: 2012-09-09 | Discharge: 2012-09-13 | DRG: 897 | Disposition: A | Discharge status: Home / Self Care | Payer: Self-pay | Source: Ambulatory Visit | Attending: Psychiatry | Admitting: Psychiatry

## 2012-09-09 ENCOUNTER — Encounter (HOSPITAL_COMMUNITY): Payer: Self-pay

## 2012-09-09 ENCOUNTER — Encounter (HOSPITAL_COMMUNITY): Payer: Self-pay | Admitting: *Deleted

## 2012-09-09 DIAGNOSIS — F29 Unspecified psychosis not due to a substance or known physiological condition: Secondary | ICD-10-CM | POA: Insufficient documentation

## 2012-09-09 DIAGNOSIS — Y929 Unspecified place or not applicable: Secondary | ICD-10-CM | POA: Insufficient documentation

## 2012-09-09 DIAGNOSIS — Z79899 Other long term (current) drug therapy: Secondary | ICD-10-CM

## 2012-09-09 DIAGNOSIS — R45851 Suicidal ideations: Secondary | ICD-10-CM

## 2012-09-09 DIAGNOSIS — F1210 Cannabis abuse, uncomplicated: Secondary | ICD-10-CM | POA: Diagnosis present

## 2012-09-09 DIAGNOSIS — F209 Schizophrenia, unspecified: Secondary | ICD-10-CM | POA: Diagnosis present

## 2012-09-09 DIAGNOSIS — S6990XA Unspecified injury of unspecified wrist, hand and finger(s), initial encounter: Secondary | ICD-10-CM | POA: Insufficient documentation

## 2012-09-09 DIAGNOSIS — F603 Borderline personality disorder: Secondary | ICD-10-CM | POA: Diagnosis present

## 2012-09-09 DIAGNOSIS — J45909 Unspecified asthma, uncomplicated: Secondary | ICD-10-CM | POA: Diagnosis present

## 2012-09-09 DIAGNOSIS — F1994 Other psychoactive substance use, unspecified with psychoactive substance-induced mood disorder: Secondary | ICD-10-CM | POA: Diagnosis present

## 2012-09-09 DIAGNOSIS — F1910 Other psychoactive substance abuse, uncomplicated: Secondary | ICD-10-CM | POA: Diagnosis present

## 2012-09-09 DIAGNOSIS — F3289 Other specified depressive episodes: Secondary | ICD-10-CM | POA: Insufficient documentation

## 2012-09-09 DIAGNOSIS — F1410 Cocaine abuse, uncomplicated: Principal | ICD-10-CM | POA: Diagnosis present

## 2012-09-09 DIAGNOSIS — F319 Bipolar disorder, unspecified: Secondary | ICD-10-CM | POA: Diagnosis present

## 2012-09-09 DIAGNOSIS — X58XXXA Exposure to other specified factors, initial encounter: Secondary | ICD-10-CM | POA: Insufficient documentation

## 2012-09-09 DIAGNOSIS — F329 Major depressive disorder, single episode, unspecified: Secondary | ICD-10-CM | POA: Insufficient documentation

## 2012-09-09 DIAGNOSIS — S60221A Contusion of right hand, initial encounter: Secondary | ICD-10-CM

## 2012-09-09 DIAGNOSIS — Y939 Activity, unspecified: Secondary | ICD-10-CM | POA: Insufficient documentation

## 2012-09-09 DIAGNOSIS — Z8659 Personal history of other mental and behavioral disorders: Secondary | ICD-10-CM | POA: Insufficient documentation

## 2012-09-09 DIAGNOSIS — F1010 Alcohol abuse, uncomplicated: Secondary | ICD-10-CM | POA: Diagnosis present

## 2012-09-09 DIAGNOSIS — F17200 Nicotine dependence, unspecified, uncomplicated: Secondary | ICD-10-CM | POA: Insufficient documentation

## 2012-09-09 HISTORY — DX: Unspecified asthma, uncomplicated: J45.909

## 2012-09-09 HISTORY — DX: Schizophrenia, unspecified: F20.9

## 2012-09-09 HISTORY — DX: Bipolar disorder, unspecified: F31.9

## 2012-09-09 LAB — COMPREHENSIVE METABOLIC PANEL
ALT: 16 U/L (ref 0–53)
CO2: 25 mEq/L (ref 19–32)
Calcium: 9.5 mg/dL (ref 8.4–10.5)
Chloride: 104 mEq/L (ref 96–112)
Creatinine, Ser: 0.96 mg/dL (ref 0.50–1.35)
GFR calc Af Amer: >90 mL/min (ref >90)
GFR calc non Af Amer: >90 mL/min (ref >90)
Glucose, Bld: 82 mg/dL (ref 70–99)
Total Bilirubin: 0.5 mg/dL (ref 0.3–1.2)

## 2012-09-09 LAB — RAPID URINE DRUG SCREEN, HOSP PERFORMED
Opiates: NOT DETECTED
Tetrahydrocannabinol: POSITIVE — AB

## 2012-09-09 LAB — CBC
Hemoglobin: 13.7 g/dL (ref 13.0–17.0)
MCH: 32 pg (ref 26.0–34.0)
MCV: 89.5 fL (ref 78.0–100.0)
RBC: 4.28 MIL/uL (ref 4.22–5.81)

## 2012-09-09 LAB — SALICYLATE LEVEL: Salicylate Lvl: <2 mg/dL — ABNORMAL LOW (ref 2.8–20.0)

## 2012-09-09 MED ORDER — TRAZODONE HCL 50 MG PO TABS
50.0000 mg | ORAL_TABLET | Freq: Every evening | ORAL | Status: DC | PRN
  Administered 2012-09-09 – 2012-09-12 (×4): 50 mg via ORAL
  Filled 2012-09-09: qty 1
  Filled 2012-09-09: qty 14
  Filled 2012-09-09 (×3): qty 1

## 2012-09-09 MED ORDER — ONDANSETRON HCL 4 MG PO TABS: 4.0000 mg | ORAL_TABLET | Freq: Three times a day (TID) | ORAL | Status: DC | PRN

## 2012-09-09 MED ORDER — LORAZEPAM 0.5 MG PO TABS
0.5000 mg | ORAL_TABLET | Freq: Four times a day (QID) | ORAL | Status: DC | PRN
  Administered 2012-09-09 – 2012-09-12 (×4): 0.5 mg via ORAL
  Filled 2012-09-09 (×4): qty 1

## 2012-09-09 MED ORDER — ACETAMINOPHEN 325 MG PO TABS: 650.0000 mg | ORAL_TABLET | ORAL | Status: DC | PRN

## 2012-09-09 MED ORDER — LORAZEPAM 1 MG PO TABS: 1.0000 mg | ORAL_TABLET | Freq: Three times a day (TID) | ORAL | Status: DC | PRN

## 2012-09-09 MED ORDER — NICOTINE 21 MG/24HR TD PT24: 21.0000 mg | MEDICATED_PATCH | Freq: Every day | TRANSDERMAL | Status: DC

## 2012-09-09 MED ORDER — ACETAMINOPHEN 325 MG PO TABS
650.0000 mg | ORAL_TABLET | Freq: Four times a day (QID) | ORAL | Status: DC | PRN
  Administered 2012-09-11: 650 mg via ORAL

## 2012-09-09 MED ORDER — MAGNESIUM HYDROXIDE 400 MG/5ML PO SUSP: 30.0000 mL | Freq: Every day | ORAL | Status: DC | PRN

## 2012-09-09 MED ORDER — NICOTINE 21 MG/24HR TD PT24
21.0000 mg | MEDICATED_PATCH | Freq: Every day | TRANSDERMAL | Status: DC
  Administered 2012-09-10 – 2012-09-13 (×4): 21 mg via TRANSDERMAL
  Filled 2012-09-09 (×6): qty 1

## 2012-09-09 MED ORDER — ALUM & MAG HYDROXIDE-SIMETH 200-200-20 MG/5ML PO SUSP: 30.0000 mL | ORAL | Status: DC | PRN

## 2012-09-09 NOTE — ED Provider Notes (Signed)
No acute, new issues reported.  AF  VSS  Ashby Dawes, MD 09/09/12 [phone]

## 2012-09-09 NOTE — BH Assessment (Signed)
[facility] Assessment Progress Note     This Clinical research associate was informed by Letitia Libra that pt has been accepted to [facility] for inpatient psychiatric. Consulted with pt's nurse Merry Lofty and provided him with pt's bed assignment and accepting doctor information and report number [phone]. All support documentation is complete. Pt pending transfer to [facility] at this time. Consulted Dr. Dan Humphreys about pt's disposition.   Glorious Peach, MS, LCASA Assessment Counselor

## 2012-09-09 NOTE — ED Notes (Signed)
Pt is sleeping, Pt woke when spoken too, but did not answer if he wanted nicotine patch. Pt fell back to sleep quickly

## 2012-09-09 NOTE — ED Notes (Signed)
Patient accepted at [facility] by Dr. Lucianne Muss.  Stable for transfer.  Geoffery Lyons, MD 09/09/12 [phone]
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
## 2012-09-09 NOTE — Progress Notes (Signed)
22 year old male pt admitted on voluntary basis. On admission pt reports that he took a razor to his forearm yesterday and cited "life problems" as the reason for doing so. Pt then stated after he did that he cleaned his wounds and walked to the ED to get help. When asked about what kind of help he stated for his depression and that he wanted to get back on his medications and reports having been off his medications for the past couple years. Pt also endorses auditory hallucinations and daily marijuana usage and frequent alcohol usage. Pt does endorse depression but denies SI on admission and able to contract for safety on the unit. Pt was oriented to the unit and safety maintained.

## 2012-09-09 NOTE — ED Notes (Signed)
Belonging inventoried. Razor and screw were both found and disposed of in sharps box.

## 2012-09-09 NOTE — ED Notes (Signed)
Patient sitting up eating breakfast.  Sitter at bedside.  Suicide precautions maintained.

## 2012-09-09 NOTE — ED Notes (Signed)
Pt has been wanded by security. 

## 2012-09-09 NOTE — ED Notes (Signed)
Patient is aware that we need a urine specimen.   Patient is paranoid about the term "specimen".   Patient cannot give at this time.

## 2012-09-09 NOTE — Progress Notes (Signed)
Patient did attend the evening karaoke group. Pt was engaged, supportive, and participated by singing a couple of songs.   

## 2012-09-09 NOTE — BH Assessment (Signed)
[facility] Assessment Progress Note      Consulted with EDP Dr. Dan Humphreys who is in agreement with pt being referred to [facility] for inpatient Psychiatric treatment.  Glorious Peach, MS, LCASA Assessment Counselor

## 2012-09-09 NOTE — ED Notes (Addendum)
Patient moved from [facility] to [facility].  Patient walked with sitter and tech.  Patient was asked for urine before he was moved.    Did not give sample prior to moving.
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
## 2012-09-09 NOTE — ED Notes (Signed)
Pt reports cutting arms, hx of same, pt sts has had a back week and he is trying to kill self and cant think of any good things.
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
## 2012-09-09 NOTE — Tx Team (Signed)
Initial Interdisciplinary Treatment Plan  PATIENT STRENGTHS: (choose at least two) Ability for insight Average or above average intelligence Capable of independent living  PATIENT STRESSORS: Medication change or noncompliance Substance abuse   PROBLEM LIST: Problem List/Patient Goals Date to be addressed Date deferred Reason deferred Estimated date of resolution  Depression 09/09/12     Substance Abuse 09/09/12                                                DISCHARGE CRITERIA:  Ability to meet basic life and health needs Improved stabilization in mood, thinking, and/or behavior Verbal commitment to aftercare and medication compliance  PRELIMINARY DISCHARGE PLAN: Attend aftercare/continuing care group Return to previous living arrangement  PATIENT/FAMIILY INVOLVEMENT: This treatment plan has been presented to and reviewed with the patient, [name] [name], and/or family member, .  The patient and family have been given the opportunity to ask questions and make suggestions.  Hatcher Froning, [place] 09/09/2012, 6:07 PM

## 2012-09-09 NOTE — ED Notes (Signed)
Superficial scratch on R wrist.  Does not require wrap or dressing.

## 2012-09-10 ENCOUNTER — Encounter (HOSPITAL_COMMUNITY): Payer: Self-pay | Admitting: Psychiatry

## 2012-09-10 DIAGNOSIS — F1994 Other psychoactive substance use, unspecified with psychoactive substance-induced mood disorder: Secondary | ICD-10-CM | POA: Diagnosis present

## 2012-09-10 DIAGNOSIS — F1910 Other psychoactive substance abuse, uncomplicated: Secondary | ICD-10-CM | POA: Diagnosis present

## 2012-09-10 DIAGNOSIS — F39 Unspecified mood [affective] disorder: Secondary | ICD-10-CM

## 2012-09-10 DIAGNOSIS — F1210 Cannabis abuse, uncomplicated: Secondary | ICD-10-CM

## 2012-09-10 DIAGNOSIS — F1010 Alcohol abuse, uncomplicated: Secondary | ICD-10-CM

## 2012-09-10 DIAGNOSIS — F1410 Cocaine abuse, uncomplicated: Principal | ICD-10-CM

## 2012-09-10 MED ORDER — ENSURE COMPLETE PO LIQD
237.0000 mL | Freq: Three times a day (TID) | ORAL | Status: DC
  Administered 2012-09-10 – 2012-09-13 (×9): 237 mL via ORAL

## 2012-09-10 MED ORDER — ADULT MULTIVITAMIN W/MINERALS CH
1.0000 | ORAL_TABLET | Freq: Every day | ORAL | Status: DC
  Administered 2012-09-10 – 2012-09-13 (×4): 1 via ORAL
  Filled 2012-09-10 (×6): qty 1

## 2012-09-10 NOTE — Progress Notes (Signed)
D:  Per pt self inventory pt reports sleeping fair, appetite improving, energy level low, ability to pay attention improving, rates depression at a 7 out of 10 and hopelessness at a 4 out of 10, on and off SI, denies SI at this time, contracts for safety, denies AVH/HI, pt slept for most of the morning but now seems to have a lot of energy, smiling and joking with other patients, pt having some anxiety, c/o depression today.     A:  Emotional support provided, Encouraged pt to continue with treatment plan and attend all group activities, q15 min checks maintained for safety.  R:  Pt is anxious, attending groups on [address] hall this afternoon, pt is pleasant with staff and peers.

## 2012-09-10 NOTE — Progress Notes (Signed)
Adult Psychoeducational Group Note  Date:  09/10/2012 Time:  1:29 PM  Group Topic/Focus:  Relapse Prevention Planning:   The focus of this group is to define relapse and discuss the need for planning to combat relapse.  Participation Level:  Active  Participation Quality:  Appropriate, Attentive and Monopolizing  Affect:  Appropriate  Cognitive:  Alert and Appropriate  Insight: Good  Engagement in Group:  Engaged and Monopolizing  Modes of Intervention:  Activity, Discussion, Socialization and Support  Additional Comments:  Pt came to group and shared for a lot of the group. Pt had to be redirected so this writer could move on to the next part of group.   Cathlean Cower 09/10/2012, 1:29 PM

## 2012-09-10 NOTE — Progress Notes (Signed)
Nutrition Brief Note  Pt meets criteria for severe MALNUTRITION in the context of social/environmental as evidenced by <50% estimated energy intake with 13.5-15.8% weight loss in the past month per pt report.  Patient identified on the Malnutrition Screening Tool (MST) Report  Body mass index is 22.96 kg/(m^2). Patient meets criteria for normal weight based on current BMI.   Intervention: - Ensure Complete TID - Multivitamin 1 tablet PO daily - Spiritual care consult - discussed pt's issues with grief from mother's loss and pt's report of lack of support with chaplain  - Discussed ways to improve appetite  Met with pt who reports recent loss of his mother less than 1 month ago. States that he has lost 25-30 pounds since then due to grief, not sleeping, and not eating well. Reports he wanted to starve to death. Pt reported frequent alcohol usage. Pt reports he cooks for himself at home and does not like to eat out a lot because he states "part of my mental illness is that I think people are trying to poison my food when I eat out". Pt reports minimal PO intake since admission. Pt interested in getting Ensure Complete for additional nutrition. Discussed ways to improve appetite such as small frequent meals/snacks. Pt expressed understanding and appreciation. No further nutrition intervention warranted at this time.   Levon Hedger MS, RD, LDN [phone] Pager [phone] After Hours Pager

## 2012-09-10 NOTE — BHH Suicide Risk Assessment (Signed)
Suicide Risk Assessment  Admission Assessment     Nursing information obtained from:  Patient Demographic factors:  Male;Adolescent or young adult;Low socioeconomic status Current Mental Status:  Self-harm thoughts Loss Factors:  Financial problems / change in socioeconomic status Historical Factors:  Family history of mental illness or substance abuse Risk Reduction Factors:  Living with another person, especially a relative  CLINICAL FACTORS:   Bipolar Disorder:   Depressive phase Alcohol/Substance Abuse/Dependencies  COGNITIVE FEATURES THAT CONTRIBUTE TO RISK:  Closed-mindedness Polarized thinking Thought constriction (tunnel vision)    SUICIDE RISK:   Moderate:  Frequent suicidal ideation with limited intensity, and duration, some specificity in terms of plans, no associated intent, good self-control, limited dysphoria/symptomatology, some risk factors present, and identifiable protective factors, including available and accessible social support.  PLAN OF CARE: Supportive approach/coping skills/relapse prevention                               Reassess and address the co morbidities  I certify that inpatient services furnished can reasonably be expected to improve the patient's condition.  Treylan Mcclintock A 09/10/2012, 5:03 PM

## 2012-09-10 NOTE — Progress Notes (Signed)
Adult Psychoeducational Group Note  Date:  09/10/2012 Time:  3:19 PM  Group Topic/Focus:  Therapeutic Activity  Participation Level:  Did Not Attend   Dalia Heading 09/10/2012, 3:19 PM

## 2012-09-10 NOTE — BHH Group Notes (Signed)
[facility] LCSW Group Therapy  09/10/2012 4:47 PM  Type of Therapy:  Group Therapy  Participation Level:  Did Not Attend   SmartHerbert Seta 09/10/2012, 4:47 PM

## 2012-09-10 NOTE — Tx Team (Signed)
Interdisciplinary Treatment Plan Update (Adult)  Date: 09/10/2012   Time Reviewed: 9:48 AM  Progress in Treatment:  Attending groups:No.  Participating in groups:  No.  Taking medication as prescribed: Yes  Tolerating medication: Yes  Family/Significant othe contact made: Not yet. SPE required for this pt.   Patient understands diagnosis: Yes, AEB seeking treatment for depression, SI, and substance abuse.  Discussing patient identified problems/goals with staff: Yes  Medical problems stabilized or resolved: Yes  Denies suicidal/homicidal ideation: Passive SI. Able to contract for safety.  Patient has not harmed self or Others: Yes  New problem(s) identified: Pt currently not attending groups/discharge planning. Pt also recently changed name in attempt to avoid prison time.  Discharge Plan or Barriers: Pt currently not attending d/c planning and is not participating in aftercare plan. CSW to continue to explore aftercare options with pt.  Additional comments: [name] [name] is an 22 y.o. male. Pt presents to ED with C/O increased depression and suicidal ideations.Pt reports that he cut his arm with a razor. " i tried to kill myself" Pt has superficial scratches on his arm. Pt states that if he was not successful with this attempt that he would attempt to hang,drown, or jump off of a bridge. Pt reports that he began feeling depressed yesterday after reflecting on his life. Pt reports that he had his life all planned out since he was a kid and his life is not going as expected. Pt reports that he drank to the point of being drunk prior to presenting to the ER.Pt states that he is the "black sheep" of his family. Pt reports that he is unable to gain employment because of his mental "instability" stating that nobody will hire him. Pt reports stressors to include the recent death of his mother whom he reports died of Cancer last month. Pt reports that he was unable to attend her funeral because he could not  bear to see her like that. Pt reports frequent Cocaine,Marijunana, and Etoh use. Pt denies current AVH and HI. Pt is unable to contract for safety. Pt referred to [facility] for review and bed placement. Reason for Continuation of Hospitalization: Medication management Mood stabilization Withdrawals SI Estimated length of stay: 3-5 days  For review of initial/current patient goals, please see plan of care.  Attendees:  Patient:    Family:    Physician: Geoffery Lyons MD 09/10/2012 9:48 AM   Nursing: Sue Lush RN  09/10/2012 9:48 AM   Clinical Social Worker Yacqub Baston Smart, LCSWA  09/10/2012 9:48 AM   Other: Aggie PA 09/10/2012 9:48 AM   Other: Darden Dates Nurse RN 09/10/2012 9:48 AM  Other: Massie Kluver, Community Care Coordinator  09/10/2012 9:48 AM   Other:    Scribe for Treatment Team:  [employer] LCSWA 09/10/2012 9:48 AM

## 2012-09-10 NOTE — Progress Notes (Signed)
Patient ID: [name] [name], male   DOB: 04/02/1988, 22 y.o.   MRN: [id number] D: Pt. Pt. Reports "I tried to kill my self, depression and anxiety, problems coping" been using alcohol and marijuana, using to deading" the pain" Pt. Shows me a superficial cut to left leg. Pt. Frigidity, looking down on the floor and away from Clinical research associate. Pt. Reports AVH, : see people, cartoon characters, see masked guy following me, hear voices of demons. A: Writer introduced self to client and encouraged group. Writer provided emotional support by listening. Staff encouraged group. R: Pt. Is safe on the unit. Pt. Attended karaoke.                                     ff

## 2012-09-10 NOTE — Progress Notes (Signed)
Pt is very talkative this afternoon and reports to writer that he sometimes sees visions. He reports that he was in [facility] four days ago and was attacked by wolves. He was fighting them and yelling when someone came to help. They told him that there was no wolves and that he was not bleeding even though he could see blood on his arm. He reports that he saw a man with pins in his head sitting in his room this morning. Pt said that he has taken medication for hallucinations in the past and it makes the hallucinations worse. Pt stated that he learns something new everyday and he recently learned that [place] was a state that belonged to the [place]. When writer said that [place] was part of the [place] also, he said that he did not know and that he has an 51 year old daughter that lives there with her mother. He said that it is ok that he does not see her because he has three other children. Supported pt to express feelings. Offered encouragement and 15 minute checks. Pt denies si and hi. Safety maintained on the unit.

## 2012-09-10 NOTE — BHH Group Notes (Signed)
[facility] LCSW Aftercare Discharge Planning Group Note   09/10/2012 9:12 AM  Participation Quality:  DID NOT ATTEND   Smart, [name] [name]

## 2012-09-10 NOTE — H&P (Signed)
Psychiatric Admission Assessment Adult  Patient Identification:  [name] [name] Date of Evaluation:  09/10/2012 Chief Complaint:  MAJOR DEPRESSIVE DISORDER  POLYSUBSTANCE ABUSE History of Present Illness:: Was wanting to kill himself Says he was here before under a different name [name] [name]. Could not handle everything that is going on in his life. States that his mother died of cancer a month ago. She was in [place]. He did not go to the funeral.   [place] Ouch he drinks  white liquor, and uses cocaine sometimes. Also smoke marijuana. He has not been able to keep a job  He had been drinking, got very upset was going to kill himself. He cut himself (superfical laceration) came for help. States he was planing to hang himself, or jump from a bridge.  He states he had been in [facility] twice recently Elements:  Location:  in patient. Quality:  unbale to function. Severity:  severe. Timing:  every day. Duration:  last few months. Context:  active use of substances incresed depression unable to handle every day stressors, grieving the death of his mother . Associated Signs/Synptoms: Depression Symptoms:  depressed mood, insomnia, difficulty concentrating, suicidal thoughts with specific plan, suicidal attempt, anxiety, loss of energy/fatigue, disturbed sleep, (Hypo) Manic Symptoms:  Impulsivity, Irritable Mood, Labiality of Mood, Anxiety Symptoms:  Excessive Worry, Psychotic Symptoms:  Denies PTSD Symptoms: Negative  Psychiatric Specialty Exam: Physical Exam  Review of Systems  Constitutional: Positive for weight loss.  HENT: Negative.   Eyes: Negative.   Respiratory: Negative.   Cardiovascular: Negative.   Gastrointestinal: Negative.   Genitourinary: Negative.   Musculoskeletal: Negative.   Skin: Negative.   Neurological: Positive for weakness.  Endo/Heme/Allergies: Negative.   Psychiatric/Behavioral: Positive for depression, suicidal ideas and substance abuse. The  patient is nervous/anxious and has insomnia.     Blood pressure 109/73, pulse 102, temperature 97.6 F (36.4 C), temperature source Oral, resp. rate 16, height 5\' 10"  (1.778 m), weight 72.576 kg (160 lb).Body mass index is 22.96 kg/(m^2).  General Appearance: Fairly Groomed  Patent attorney::  Poor  Speech:  Clear and Coherent and Slow  Volume:  Decreased  Mood:  Anxious, Depressed, Dysphoric and Hopeless  Affect:  Restricted  Thought Process:  Coherent and Goal Directed  Orientation:  Full (Time, Place, and Person)  Thought Content:  worries, concerns, grieving the loss of his mother  Suicidal Thoughts:  Yes.  without intent/plan  Homicidal Thoughts:  No  Memory:  Immediate;   Fair Recent;   Fair Remote;   Fair  Judgement:  Fair  Insight:  superficial  Psychomotor Activity:  Restlessness  Concentration:  Fair  Recall:  Fair  Akathisia:  No  Handed:  Right  AIMS (if indicated):     Assets:  Desire for Improvement  Sleep:  Number of Hours: 6    Past Psychiatric History: Diagnosis: Bipolar Disorder, Alcohol, Cocaine, Marijuana Abuse R/O dependence  Hospitalizations: [facility]  Outpatient Care: Denies  Substance Abuse Care: [facility], [facility]  Self-Mutilation: Yes  Suicidal Attempts: Yes  Violent Behaviors: Yes   Past Medical History:   Past Medical History  Diagnosis Date  . Asthma   . Depression   . Schizophrenia   . Bipolar 1 disorder   . Borderline personality disorder    None. Allergies:   Allergies  Allergen Reactions  . Penicillins Anaphylaxis and Swelling  . Effexor (Venlafaxine) Other (See Comments)    Side effects are really bad for patient  . Gabapentin  Other (See Comments)    Side effects are really bad for patient  . Haldol (Haloperidol Lactate) Other (See Comments)    seizure   PTA Medications: No prescriptions prior to admission    Previous Psychotropic Medications:  Medication/Dose  Claims he does not remeber                Substance Abuse History in the last 12 months:  yes  Consequences of Substance Abuse: Blackouts:    Social History:  reports that he has been smoking Cigarettes.  He has been smoking about 1.50 packs per day. He does not have any smokeless tobacco history on file. He reports that  drinks alcohol. He reports that he uses illicit drugs (Cocaine and Marijuana). Additional Social History:                      Current Place of Residence:  Lives with step and cousin Place of Birth:   Family Members: Marital Status:  Single Children:  Sons:10  Daughters:3,4 Relationships: Education:  10 th grade Educational Problems/Performance: Religious Beliefs/Practices: History of Abuse (Emotional/Phsycial/Sexual) Occupational Experiences; Unable to keep a job Hotel manager History:  Media planner History: Denies Hobbies/Interests:  Family History:  History reviewed. No pertinent family history.  Results for orders placed during the hospital encounter of 09/09/12 (from the past 72 hour(s))  ACETAMINOPHEN LEVEL     Status: None   Collection Time    09/09/12  3:29 AM      Result Value Range   Acetaminophen (Tylenol), Serum <15.0  10 - 30 ug/mL   Comment:            THERAPEUTIC CONCENTRATIONS VARY     SIGNIFICANTLY. A RANGE OF 10-30     ug/mL MAY BE AN EFFECTIVE     CONCENTRATION FOR MANY PATIENTS.     HOWEVER, SOME ARE BEST TREATED     AT CONCENTRATIONS OUTSIDE THIS     RANGE.     ACETAMINOPHEN CONCENTRATIONS     >150 ug/mL AT 4 HOURS AFTER     INGESTION AND >50 ug/mL AT 12     HOURS AFTER INGESTION ARE     OFTEN ASSOCIATED WITH TOXIC     REACTIONS.  CBC     Status: Abnormal   Collection Time    09/09/12  3:29 AM      Result Value Range   WBC 17.3 (*) 4.0 - 10.5 K/uL   RBC 4.28  4.22 - 5.81 MIL/uL   Hemoglobin 13.7  13.0 - 17.0 g/dL   HCT 16.1 (*) 09.6 - 04.5 %   MCV 89.5  78.0 - 100.0 fL   MCH 32.0  26.0 - 34.0 pg   MCHC 35.8  30.0 - 36.0 g/dL   RDW 40.9  81.1  - 91.4 %   Platelets 146 (*) 150 - 400 K/uL  COMPREHENSIVE METABOLIC PANEL     Status: None   Collection Time    09/09/12  3:29 AM      Result Value Range   Sodium 138  135 - 145 mEq/L   Potassium 3.6  3.5 - 5.1 mEq/L   Chloride 104  96 - 112 mEq/L   CO2 25  19 - 32 mEq/L   Glucose, Bld 82  70 - 99 mg/dL   BUN 10  6 - 23 mg/dL   Creatinine, Ser 7.82  0.50 - 1.35 mg/dL   Calcium 9.5  8.4 - 95.6 mg/dL   Total Protein  7.4  6.0 - 8.3 g/dL   Albumin 3.9  3.5 - 5.2 g/dL   AST 19  0 - 37 U/L   ALT 16  0 - 53 U/L   Alkaline Phosphatase 88  39 - 117 U/L   Total Bilirubin 0.5  0.3 - 1.2 mg/dL   GFR calc non Af Amer >90  >90 mL/min   GFR calc Af Amer >90  >90 mL/min   Comment:            The eGFR has been calculated     using the CKD EPI equation.     This calculation has not been     validated in all clinical     situations.     eGFR's persistently     <90 mL/min signify     possible Chronic Kidney Disease.  ETHANOL     Status: None   Collection Time    09/09/12  3:29 AM      Result Value Range   Alcohol, Ethyl (B) <11  0 - 11 mg/dL   Comment:            LOWEST DETECTABLE LIMIT FOR     SERUM ALCOHOL IS 11 mg/dL     FOR MEDICAL PURPOSES ONLY  SALICYLATE LEVEL     Status: Abnormal   Collection Time    09/09/12  3:29 AM      Result Value Range   Salicylate Lvl <2.0 (*) 2.8 - 20.0 mg/dL  URINE RAPID DRUG SCREEN (HOSP PERFORMED)     Status: Abnormal   Collection Time    09/09/12 12:46 PM      Result Value Range   Opiates NONE DETECTED  NONE DETECTED   Cocaine NONE DETECTED  NONE DETECTED   Benzodiazepines NONE DETECTED  NONE DETECTED   Amphetamines NONE DETECTED  NONE DETECTED   Tetrahydrocannabinol POSITIVE (*) NONE DETECTED   Barbiturates POSITIVE (*) NONE DETECTED   Comment:            DRUG SCREEN FOR MEDICAL PURPOSES     ONLY.  IF CONFIRMATION IS NEEDED     FOR ANY PURPOSE, NOTIFY LAB     WITHIN 5 DAYS.                LOWEST DETECTABLE LIMITS     FOR URINE DRUG  SCREEN     Drug Class       Cutoff (ng/mL)     Amphetamine      1000     Barbiturate      200     Benzodiazepine   200     Tricyclics       300     Opiates          300     Cocaine          300     THC              50   Psychological Evaluations:  Assessment:   AXIS I:  Alcohol, Cannabis, Cocaine Abuse, Mood Disorder NOS AXIS II:  Deferred AXIS III:   Past Medical History  Diagnosis Date  . Asthma   . Depression   . Schizophrenia   . Bipolar 1 disorder   . Borderline personality disorder    AXIS IV:  other psychosocial or environmental problems and problems with primary support group AXIS V:  41-50 serious symptoms  Treatment Plan/Recommendations:  Supportive approach/coping skills/relapse prevention  Reassess and address the co morbidities  Treatment Plan Summary: Daily contact with patient to assess and evaluate symptoms and progress in treatment Medication management Current Medications:  Current Facility-Administered Medications  Medication Dose Route Frequency Provider Last Rate Last Dose  . acetaminophen (TYLENOL) tablet 650 mg  650 mg Oral Q6H PRN Nelly Rout, MD      . LORazepam (ATIVAN) tablet 0.5 mg  0.5 mg Oral Q6H PRN Nelly Rout, MD   0.5 mg at 09/09/12 2240  . magnesium hydroxide (MILK OF MAGNESIA) suspension 30 mL  30 mL Oral Daily PRN Nelly Rout, MD      . nicotine (NICODERM CQ - dosed in mg/24 hours) patch 21 mg  21 mg Transdermal Q0600 Nelly Rout, MD   21 mg at 09/10/12 1610  . traZODone (DESYREL) tablet 50 mg  50 mg Oral QHS PRN Nelly Rout, MD   50 mg at 09/09/12 2240    Observation Level/Precautions:  15 minute checks  Laboratory:  As per the ED  Psychotherapy:  Individual/group  Medications:  Detox/reassess medications  Consultations:    Discharge Concerns:    Estimated LOS: 5 days  Other:     I certify that inpatient services furnished can reasonably be expected to  improve the patient's condition.   Reagan Behlke A 7/25/20148:42 AM

## 2012-09-11 DIAGNOSIS — F329 Major depressive disorder, single episode, unspecified: Secondary | ICD-10-CM

## 2012-09-11 MED ORDER — ARIPIPRAZOLE 5 MG PO TABS
2.5000 mg | ORAL_TABLET | Freq: Once | ORAL | Status: AC
  Administered 2012-09-11: 2.5 mg via ORAL
  Filled 2012-09-11 (×2): qty 1

## 2012-09-11 MED ORDER — ARIPIPRAZOLE 5 MG PO TABS
5.0000 mg | ORAL_TABLET | Freq: Every day | ORAL | Status: DC
  Administered 2012-09-11 – 2012-09-13 (×3): 5 mg via ORAL
  Filled 2012-09-11 (×4): qty 1
  Filled 2012-09-11: qty 14
  Filled 2012-09-11 (×2): qty 1

## 2012-09-11 MED ORDER — ARIPIPRAZOLE 5 MG PO TABS
5.0000 mg | ORAL_TABLET | Freq: Every day | ORAL | Status: DC
  Filled 2012-09-11 (×2): qty 1

## 2012-09-11 NOTE — Progress Notes (Signed)
[facility] MD Progress Note  09/11/2012 2:48 PM [name] [name]  MRN:  [id number]  Subjective:  Patient has been staying that I'm fine" patient is seen and is active in the unit millieu.  Diagnosis: MDD and polysubstance abuse  ADL's:  Intact  Sleep: Fair  Appetite:  Fair  Suicidal Ideation:  denies Homicidal Ideation:  denies AEB (as evidenced by):  Psychiatric Specialty Exam: Review of Systems  Constitutional: Negative.  Negative for fever, chills, weight loss, malaise/fatigue and diaphoresis.  HENT: Negative for congestion and sore throat.   Eyes: Negative for blurred vision, double vision and photophobia.  Respiratory: Negative for cough, shortness of breath and wheezing.   Cardiovascular: Negative for chest pain, palpitations and PND.  Gastrointestinal: Negative for heartburn, nausea, vomiting, abdominal pain, diarrhea and constipation.  Musculoskeletal: Negative for myalgias, joint pain and falls.  Neurological: Negative for dizziness, tingling, tremors, sensory change, speech change, focal weakness, seizures, loss of consciousness, weakness and headaches.  Endo/Heme/Allergies: Negative for polydipsia. Does not bruise/bleed easily.  Psychiatric/Behavioral: Negative for depression, suicidal ideas, hallucinations, memory loss and substance abuse. The patient is not nervous/anxious and does not have insomnia.     Blood pressure 111/73, pulse 86, temperature 97.4 F (36.3 C), temperature source Oral, resp. rate 16, height 5\' 10"  (1.778 m), weight 72.576 kg (160 lb).Body mass index is 22.96 kg/(m^2).  General Appearance: Casual  Eye Contact::  Fair  Speech:  Clear and Coherent  Volume:  Normal  Mood:  Depressed  Affect:  Depressed  Thought Process:  Goal Directed  Orientation:  Full (Time, Place, and Person)  Thought Content:  Rumination  Suicidal Thoughts:  Yes.  without intent/plan  Homicidal Thoughts:  No  Memory:  NA  Judgement:  Impaired  Insight:  Lacking  Psychomotor  Activity:  Decreased  Concentration:  Poor  Recall:  Poor  Akathisia:  No  Handed:  Right  AIMS (if indicated):     Assets:  Social Support  Sleep:  Number of Hours: 6   Current Medications: Current Facility-Administered Medications  Medication Dose Route Frequency Provider Last Rate Last Dose  . acetaminophen (TYLENOL) tablet 650 mg  650 mg Oral Q6H PRN Nelly Rout, MD   650 mg at 09/11/12 1213  . ARIPiprazole (ABILIFY) tablet 2.5 mg  2.5 mg Oral Once [employer], PA-C      . ARIPiprazole (ABILIFY) tablet 5 mg  5 mg Oral Daily Verne Spurr, PA-C      . feeding supplement (ENSURE COMPLETE) liquid 237 mL  237 mL Oral TID WC Lavena Bullion, RD   237 mL at 09/11/12 1209  . LORazepam (ATIVAN) tablet 0.5 mg  0.5 mg Oral Q6H PRN Nelly Rout, MD   0.5 mg at 09/11/12 1214  . magnesium hydroxide (MILK OF MAGNESIA) suspension 30 mL  30 mL Oral Daily PRN Nelly Rout, MD      . multivitamin with minerals tablet 1 tablet  1 tablet Oral Daily Lavena Bullion, RD   1 tablet at 09/11/12 0741  . nicotine (NICODERM CQ - dosed in mg/24 hours) patch 21 mg  21 mg Transdermal Q0600 Nelly Rout, MD   21 mg at 09/11/12 7829  . traZODone (DESYREL) tablet 50 mg  50 mg Oral QHS PRN Nelly Rout, MD   50 mg at 09/10/12 2220    Lab Results: No results found for this or any previous visit (from the past 48 hour(s)).  Physical Findings: AIMS: Facial and Oral Movements Muscles of Facial Expression: None,  normal Lips and Perioral Area: None, normal Jaw: None, normal Tongue: None, normal,Extremity Movements Upper (arms, wrists, hands, fingers): None, normal Lower (legs, knees, ankles, toes): None, normal, Trunk Movements Neck, shoulders, hips: None, normal, Overall Severity Severity of abnormal movements (highest score from questions above): None, normal Incapacitation due to abnormal movements: None, normal Patient's awareness of abnormal movements (rate only patient's report): No Awareness, Dental  Status Current problems with teeth and/or dentures?: No Does patient usually wear dentures?: No  CIWA:    COWS:     Treatment Plan Summary: Daily contact with patient to assess and evaluate symptoms and progress in treatment Medication management  Plan: 1. Continue crisis management and stabilization. 2. Medication management to reduce current symptoms to base line and improve patient's overall level of functioning 3. Treat health problems as indicated. 4. Develop treatment plan to decrease risk of relapse upon discharge and the need for     readmission. 5. Psycho-social education regarding relapse prevention and self care. 6. Health care follow up as needed for medical problems. 7. Continue home medications where appropriate. 8. Abilify 2.5 po now, and 5mg  at hs.   Medical Decision Making Problem Points:  Established problem, stable/improving (1) Data Points:  Review or order medicine tests (1)  I certify that inpatient services furnished can reasonably be expected to improve the patient's condition.   MASHBURN,NEIL 09/11/2012, 2:48 PM  Reviewed the information documented and agree with the treatment plan.  Mishon Blubaugh,JANARDHAHA R. 09/20/2012 12:33 PM

## 2012-09-11 NOTE — Progress Notes (Signed)
Adult Psychoeducational Group Note  Date:  09/11/2012 Time:  2:43 PM  Group Topic/Focus:  Healthy Communication:   The focus of this group is to discuss communication, barriers to communication, as well as healthy ways to communicate with others.  Participation Level:  Minimal  Participation Quality:  Inattentive  Affect:  Appropriate  Cognitive:  Lacking  Insight: Limited  Engagement in Group:  Poor  Modes of Intervention:  Discussion, Education, Socialization and Support  Additional Comments:  Pt attended group. Pt was coloring and did not engage in group.   Caswell Corwin 09/11/2012, 2:43 PM
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
## 2012-09-11 NOTE — [facility] Group Notes (Signed)
[facility] Group Notes:  (Nursing/MHT/Case Management/Adjunct)  Date: 09/11/2012  Time: 10:07 AM  Type of Therapy: self inventory review  Participation Level: Did Not Attend  Participation Quality: na  Affect: na  Cognitive: na  Insight: None  Engagement in Group: na  Modes of Intervention: na  Summary of Progress/Problems: self inventory review with RN did not attend   Malva Limes 09/11/2012, 12:06 PM

## 2012-09-11 NOTE — Progress Notes (Signed)
Patient ID: Vertell Limber, male   DOB: 04/02/1988, 22 y.o.   MRN: [id number] 09-11-12 nursing shift note: D: pt has been intrusive today, attention seeking and acted out in the am. He hit the wall with his right hand. He came to group and left early. He is having some intermittent SI.  A: RN set limits with pt and had the extender assess his right hand and she stated that the right hand did not need any further treatment. R: on his inventory sheet he wrote slept fair, appetite improving, energy normal, attention improving with his depression at 3 and hopelessness at 2 . He is able to verbally contract for the SI and denied any hi/av.  RN will monitor and Q 15 min ck's continue.

## 2012-09-11 NOTE — Progress Notes (Signed)
Patient ID: [name] [name], male   DOB: 04/02/1988, 22 y.o.   MRN: [id number] Psychoeducational Group Note  Date:  09/11/2012 Time:1300pm  Group Topic/Focus:  Identifying Needs:   The focus of this group is to help patients identify their personal needs that have been historically problematic and identify healthy behaviors to address their needs.  Participation Level:  Did Not Attend  Participation Quality:    Affect:   Cognitive:  Insight:    Engagement in Group: Additional Comments: Psychoeducational group- healthy coping skills.   [name] [name] 09/11/2012,4:06 PM

## 2012-09-11 NOTE — [facility] Group Notes (Signed)
[facility] LCSW Group Therapy  09/11/2012   10:00 AM   Type of Therapy:  Group Therapy  Participation Level:  Did Not Attend  Humza Tallerico Horton, LCSWA 09/11/2012 9:47 AM    

## 2012-09-11 NOTE — Progress Notes (Signed)
Pt observed resting in bed with eyes closed. RR WNL, even and unlabored. Level III obs in place. Pt remains safe. [name] [name]

## 2012-09-11 NOTE — Progress Notes (Signed)
The focus of this group is to help patients review their daily goal of treatment and discuss progress on daily workbooks. Pt attended the evening group session but responded minimally to discussion prompts from the Writer. Pt was child-like, rude, inappropriate and intrusive throughout the group and required constant redirection. Pt loudly shuffled a deck of cards during the session, heckled other group members and cursed at a older peer while they were adjusting his hearing aid ("What the fuck kind of noises are YOU making over there?!") When asked about his day, Pt responded "I don't really think that's any of your business, now is it?"The focus of this group is to help patients review their daily goal of treatment and discuss progress on daily workbooks.

## 2012-09-11 NOTE — Progress Notes (Signed)
Adult Psychoeducational Group Note  Date:  09/11/2012 Time:  7:06 PM  Group Topic/Focus:  Healthy Communication:   The focus of this group is to discuss communication, barriers to communication, as well as healthy ways to communicate with others.  Participation Level:  Minimal  Participation Quality:  Sharing  Affect:  Appropriate  Cognitive:  Appropriate  Insight: Appropriate  Engagement in Group:  Limited and Off Topic  Modes of Intervention:  Discussion, Education and Socialization  Additional Comments:  Pt came into the group session late. Pt was able to share an "I feel" statement. Pt did get off topic during group.   Laural Benes, Nupur Hohman 09/11/2012, 7:06 PM
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
## 2012-09-11 NOTE — Progress Notes (Signed)
Pt is silly, attention seeking in milieu. Endorses AVH of the actor Will Smith. Pt's report incongruent with behavior. No evidence of internal stimuli on observation. Redirected as needed. Medicated per orders. Denies SI/HI at this time and remains safe. Lawrence Marseilles

## 2012-09-12 DIAGNOSIS — F1920 Other psychoactive substance dependence, uncomplicated: Secondary | ICD-10-CM

## 2012-09-12 MED ORDER — LORATADINE 10 MG PO TABS
10.0000 mg | ORAL_TABLET | Freq: Every day | ORAL | Status: DC
  Administered 2012-09-12 – 2012-09-13 (×2): 10 mg via ORAL
  Filled 2012-09-12: qty 1
  Filled 2012-09-12: qty 14
  Filled 2012-09-12 (×4): qty 1

## 2012-09-12 NOTE — Progress Notes (Signed)
Pt remains silly, superficial and attention seeking on unit. Intrusive and flirtatious with peers. States that he is depressed though presentation is in conflict with report. Took trazadone but then drank 2 cups of coffee and bragged to staff and patients. Redirected as needed. Medicated per orders. Denies SI/HI, no AVH reported. Pt safe. Lawrence Marseilles

## 2012-09-12 NOTE — Progress Notes (Signed)
[facility] MD Progress Note  09/12/2012 4:07 PM Vertell Limber  MRN:  [id number]  Subjective: [name] [name] reports, "I attempted to kill myself few days ago because I was depressed. I still feel depressed. But, I'm trying to hang in there. I still feel suicidal. But i try to go to group and talk about my depression. Group helps some. My nose feels stuffy today".  [name] [name] reports feeling depressed, however, his demeanor and facial expression present otherwise. He is seen charting and playing with the other patients. He played the piano quite few times today.  Diagnosis:   Axis I: Substance Induced Mood Disorder, polysubstance abuse/dependenc Axis II: Cluster B Traits Axis III:  Past Medical History  Diagnosis Date  . Asthma   . Depression   . Schizophrenia   . Bipolar 1 disorder   . Borderline personality disorder    Axis IV: other psychosocial or environmental problems Axis V: 41-50 serious symptoms  ADL's:  Impaired  Sleep: Good  Appetite:  Good  Suicidal Ideation:  Plan:  Denies Intent:  Denies Means:  Denies Homicidal Ideation:  Plan:  Denies Intent:  Denies Means:  Denies AEB (as evidenced by):  Psychiatric Specialty Exam: Review of Systems  Constitutional: Negative.   HENT: Negative.   Eyes: Negative.   Respiratory: Negative.   Cardiovascular: Negative.   Gastrointestinal: Negative.   Genitourinary: Negative.   Musculoskeletal: Negative.   Skin: Negative.   Endo/Heme/Allergies: Negative.   Psychiatric/Behavioral: Positive for depression (rated #4) and substance abuse (polysubstance dependence). Negative for suicidal ideas, hallucinations and memory loss. The patient is not nervous/anxious and does not have insomnia.     Blood pressure 112/61, pulse 69, temperature 98.3 F (36.8 C), temperature source Oral, resp. rate 16, height 5\' 10"  (1.778 m), weight 72.576 kg (160 lb).Body mass index is 22.96 kg/(m^2).  General Appearance: Disheveled  Eye Contact::  Good  Speech:   Clear and Coherent and Normal Rate  Volume:  Normal  Mood:  Depressed and rated at #4  Affect:  Non-Congruent  Thought Process:  Coherent and Intact  Orientation:  Full (Time, Place, and Person)  Thought Content:  Rumination  Suicidal Thoughts:  No  Homicidal Thoughts:  No  Memory:  Immediate;   Good Recent;   Good Remote;   Good  Judgement:  Fair  Insight:  Fair  Psychomotor Activity:  Normal  Concentration:  Good  Recall:  Good  Akathisia:  No  Handed:  Right  AIMS (if indicated):     Assets:  Desire for Improvement  Sleep:  Number of Hours: 4.75   Current Medications: Current Facility-Administered Medications  Medication Dose Route Frequency Provider Last Rate Last Dose  . acetaminophen (TYLENOL) tablet 650 mg  650 mg Oral Q6H PRN Nelly Rout, MD   650 mg at 09/11/12 1213  . ARIPiprazole (ABILIFY) tablet 5 mg  5 mg Oral Daily Verne Spurr, PA-C   5 mg at 09/12/12 0740  . feeding supplement (ENSURE COMPLETE) liquid 237 mL  237 mL Oral TID WC Lavena Bullion, RD   237 mL at 09/12/12 1154  . LORazepam (ATIVAN) tablet 0.5 mg  0.5 mg Oral Q6H PRN Nelly Rout, MD   0.5 mg at 09/12/12 0740  . magnesium hydroxide (MILK OF MAGNESIA) suspension 30 mL  30 mL Oral Daily PRN Nelly Rout, MD      . multivitamin with minerals tablet 1 tablet  1 tablet Oral Daily Lavena Bullion, RD   1 tablet at 09/12/12 0740  .  nicotine (NICODERM CQ - dosed in mg/24 hours) patch 21 mg  21 mg Transdermal Q0600 Nelly Rout, MD   21 mg at 09/12/12 0641  . traZODone (DESYREL) tablet 50 mg  50 mg Oral QHS PRN Nelly Rout, MD   50 mg at 09/11/12 2255    Lab Results: No results found for this or any previous visit (from the past 48 hour(s)).  Physical Findings: AIMS: Facial and Oral Movements Muscles of Facial Expression: None, normal Lips and Perioral Area: None, normal Jaw: None, normal Tongue: None, normal,Extremity Movements Upper (arms, wrists, hands, fingers): None, normal Lower (legs,  knees, ankles, toes): None, normal, Trunk Movements Neck, shoulders, hips: None, normal, Overall Severity Severity of abnormal movements (highest score from questions above): None, normal Incapacitation due to abnormal movements: None, normal Patient's awareness of abnormal movements (rate only patient's report): No Awareness, Dental Status Current problems with teeth and/or dentures?: No Does patient usually wear dentures?: No  CIWA:  CIWA-Ar Total: 1 COWS:     Treatment Plan Summary: Daily contact with patient to assess and evaluate symptoms and progress in treatment Medication management  Plan: Supportive approach/coping skills/relapse prevention. Claritin 10 mg daily for nasal stuffiness. Encouraged out of room, participation in group sessions and application of coping skills when distressed. Will continue to monitor response to/adverse effects of medications in use to assure effectiveness. Continue to monitor mood, behavior and interaction with staff and other patients. Continue current plan of care.  Medical Decision Making Problem Points:  Established problem, stable/improving (1), Review of last therapy session (1) and Review of psycho-social stressors (1) Data Points:  Review of medication regiment & side effects (2) Review of new medications or change in dosage (2)  I certify that inpatient services furnished can reasonably be expected to improve the patient's condition.   Armandina Stammer I, PMHNP-BC 09/12/2012, 4:07 PM

## 2012-09-12 NOTE — [facility] Group Notes (Signed)
[facility] Group Notes:  (Nursing/MHT/Case Management/Adjunct)  Date:  09/12/2012  Time:  1:55 PM  Type of Therapy:  Nurse Education  Participation Level:  None  Participation Quality:  Drowsy  Affect:  Lethargic  Cognitive:  Appropriate  Insight:  None  Engagement in Group:  None  Modes of Intervention:  Education  Summary of Progress/Problems:  Cresenciano Lick 09/12/2012, 1:55 PM
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
## 2012-09-12 NOTE — Progress Notes (Signed)
Adult Psychoeducational Group Note  Date:  09/12/2012 Time:  7:15 PM  Group Topic/Focus:  Emotional Education:   The focus of this group is to discuss what feelings/emotions are, and how they are experienced.  Participation Level:  Did Not Attend  [name] [name] 09/12/2012, 7:15 PM

## 2012-09-12 NOTE — Progress Notes (Signed)
Patient ID: [name] [name], male   DOB: 04/02/1988, 22 y.o.   MRN: [id number] 09-12-12 nursing shift note: D: pt has been more cooperative today and less intrusive. He is having some mild anxiety. He did complain of some nasal congestion. A: staff has praised him most of the day for the improvement in his behavior and he was given ativan for the anxiety.he states he is having passive SI.  RN will attempt to get an order for a medication for his nasal congestion R: his anxiety was decreased and he is able to contract for the SI.  On his inventory sheet he wrote slept poor, appetite poor, energy normal, attention improving with his depression and hopelessness both at 1. RN will monitor and Q 15 min ck's continue.

## 2012-09-13 DIAGNOSIS — F1994 Other psychoactive substance use, unspecified with psychoactive substance-induced mood disorder: Secondary | ICD-10-CM

## 2012-09-13 DIAGNOSIS — F1910 Other psychoactive substance abuse, uncomplicated: Secondary | ICD-10-CM

## 2012-09-13 MED ORDER — LORATADINE 10 MG PO TABS: 10.0000 mg | ORAL_TABLET | Freq: Every day | ORAL | Status: AC

## 2012-09-13 MED ORDER — TRAZODONE HCL 50 MG PO TABS: 50.0000 mg | ORAL_TABLET | Freq: Every evening | ORAL | Status: AC | PRN

## 2012-09-13 MED ORDER — ARIPIPRAZOLE 5 MG PO TABS: 5.0000 mg | ORAL_TABLET | Freq: Every day | ORAL | Status: AC

## 2012-09-13 NOTE — Progress Notes (Signed)
Psychoeducational Group Note  Date:  09/13/2012 Time:  1000  Group Topic/Focus:  Therapeutic Activity  Participation Level: Did Not Attend  Participation Quality:  Not Applicable  Affect:  Not Applicable  Cognitive:  Not Applicable  Insight:  Not Applicable  Engagement in Group: Not Applicable  Additional Comments: Patient did not attend group, patient remained in bed.  Karleen Hampshire Brittini 09/13/2012, 10:58 AM

## 2012-09-13 NOTE — Discharge Summary (Signed)
Physician Discharge Summary Note  Patient:  [name] [name] is an 22 y.o., male MRN:  [id number] DOB:  04/02/1988 Patient phone:  [phone] (home)  Patient address:   [address] [place] [address],   Date of Admission:  09/09/2012 Date of Discharge: 09/13/12  Reason for Admission:  Increased depression and suicidal ideations  Discharge Diagnoses: Active Problems:   Polysubstance abuse   Substance induced mood disorder  Review of Systems  Constitutional: Negative.   HENT: Negative.   Eyes: Negative.   Respiratory: Negative.   Cardiovascular: Negative.   Gastrointestinal: Negative.   Genitourinary: Negative.   Musculoskeletal: Negative.   Skin: Negative.   Neurological: Negative.   Endo/Heme/Allergies: Negative.   Psychiatric/Behavioral: Positive for depression (Stabilized with medication prior to discharge) and substance abuse. Negative for suicidal ideas, hallucinations and memory loss. The patient is nervous/anxious (Stabilized with medication prior to discharge) and has insomnia (Stabilized with medication prior to discharge).    Axis Diagnosis:   AXIS I:  Substance Induced Mood Disorder and Polysubstance abuse AXIS II:  Deferred AXIS III:   Past Medical History  Diagnosis Date  . Asthma   . Depression   . Schizophrenia   . Bipolar 1 disorder   . Borderline personality disorder    AXIS IV:  Substance abuse AXIS V:  63  Level of Care:  OP  Hospital Course:  Was wanting to kill himself Says he was here before under a different name [name] [name]. Could not handle everything that is going on in his life. States that his mother died of cancer a month ago. She was in [place]. He did not go to the funeral. [place] Ouch he drinks white liquor, and uses cocaine sometimes. Also smoke marijuana. He has not been able to keep a job He had been drinking, got very upset was going to kill himself. He cut himself (superfical laceration) came for help. States he was planing to  hang himself, or jump from a bridge. He states he had been in [facility] twice recently.  While a patient in this hospital and after admission assessment/evaluation, it was determined based on patient's symptoms that he will need medication management to stabilize his current major depressive mood symptoms. His UDS was only positive for barbiturates of which there is no established detoxification treatment protocol. Therefore, [name] [name] did not receive any detoxification treatment protocol. However, he was ordered and received ARIPiprazole 5 mg daily for mood control and Trazodone 50 mg Q bedtime. He also was enrolled in group counseling sessions and activities where he was taught, counseled and learned coping skills that should help him cope better and manage his symptoms effectively after discharge. He also received other medication management and or monitoring for his other previously existing medical issues and concerns that he presented. He tolerated his treatment regimen without any significant adverse effects and or reactions presented.   Patient did respond positively to his treatment regimen. This is evidenced by his daily reports of improved mood, reduction of symptoms and presentation of good affect/eye contact. He attended treatment team meeting this am and met with his treatment team members. His reason for admission, present symptoms, response to treatment and discharge plans discussed with patient. Mr. [name] endorsed that his symptoms has stabilized and that he is ready for discharge to pursue psychiatric care on outpatient basis. It was then agreed upon that patient will follow-up care at in [facility] here in [place], [place] between the hours of 08-09:00 am, Monday thru  Friday. He was informed that this is a walk-in appointment as well. He will also follow-up care at [facility] with Elroy Channel for counseling sessions on 09/16/12 at 2:00 PM. The addresses,  dates, times and contact information for this clinics provided for patient in writing.  Upon discharge, [name] [name] adamantly denies any suicidal, homicidal ideations, auditory, visual hallucinations, paranoia and or delusional thoughts. He was provided with 14 days worth supply samples of his [facility] discharge medications. He left [facility] with all personal belongings via city transport in no apparent distress. Bus pass provided by [facility].  Consults:  psychiatry  Significant Diagnostic Studies:  labs: CBC with diff, CMP, UDS, Toxicology tests, U/A  Discharge Vitals:   Blood pressure 99/61, pulse 109, temperature 98 F (36.7 C), temperature source Oral, resp. rate 16, height 5\' 10"  (1.778 m), weight 72.576 kg (160 lb). Body mass index is 22.96 kg/(m^2). Lab Results:   No results found for this or any previous visit (from the past 72 hour(s)).  Physical Findings: AIMS: Facial and Oral Movements Muscles of Facial Expression: None, normal Lips and Perioral Area: None, normal Jaw: None, normal Tongue: None, normal,Extremity Movements Upper (arms, wrists, hands, fingers): None, normal Lower (legs, knees, ankles, toes): None, normal, Trunk Movements Neck, shoulders, hips: None, normal, Overall Severity Severity of abnormal movements (highest score from questions above): None, normal Incapacitation due to abnormal movements: None, normal Patient's awareness of abnormal movements (rate only patient's report): No Awareness, Dental Status Current problems with teeth and/or dentures?: No Does patient usually wear dentures?: No  CIWA:  CIWA-Ar Total: 1 COWS:     Psychiatric Specialty Exam: See Psychiatric Specialty Exam and Suicide Risk Assessment completed by Attending Physician prior to discharge.  Discharge destination:  Home  Is patient on multiple antipsychotic therapies at discharge:  No   Has Patient had three or more failed trials of antipsychotic monotherapy by history:  No  Recommended Plan  for Multiple Antipsychotic Therapies: NA     Medication List       Indication   ARIPiprazole 5 MG tablet  Commonly known as:  ABILIFY  Take 1 tablet (5 mg total) by mouth daily. For mood control   Indication:  Manic-Depression, Mood control     loratadine 10 MG tablet  Commonly known as:  CLARITIN  Take 1 tablet (10 mg total) by mouth daily. (May be purchased from the over the counter at yr local pharmacy): For allergies   Indication:  Perennial Rhinitis, Hayfever     traZODone 50 MG tablet  Commonly known as:  DESYREL  Take 1 tablet (50 mg total) by mouth at bedtime as needed for sleep. For sleep   Indication:  Trouble Sleeping       Follow-up Information   Follow up with [facility]. (Walk in between 8AM-9AM Monday through Friday for hospital followup/medication management.)    Contact information:   [address], [place] [address] phone: [phone] fax: [phone]      Follow up with [facility] On 09/16/2012. (Appt. at 2:00PM with Elroy Channel for therapy.)    Contact information:   [facility] [address] [place] [address] phone: [phone] fax: [phone]    Follow-up recommendations:  Activity:  As tolerated Diet: As recommended by your primary care doctor. Keep all scheduled follow-up appointments as recommended. Continue to work the relapse prevention plan Comments:  Take all your medications as prescribed by your mental healthcare provider. Report any adverse effects  and or reactions from your medicines to your outpatient provider promptly. Patient is instructed and cautioned to not engage in alcohol and or illegal drug use while on prescription medicines. In the event of worsening symptoms, patient is instructed to call the crisis hotline, 911 and or go to the nearest ED for appropriate evaluation and treatment of symptoms. Follow-up with your primary care provider for your other medical issues, concerns and or  health care needs.   Total Discharge Time:  Greater than 30 minutes.  Signed: Sanjuana Kava, PMHNP-BC 09/13/2012, 4:19 PM Agree with assessment and plan Madie Reno A. Dub Mikes, M.D.

## 2012-09-13 NOTE — Progress Notes (Signed)
[facility] Adult Case Management Discharge Plan :  Will you be returning to the same living situation after discharge: No. At discharge, do you have transportation home?:Yes,  bus pass Do you have the ability to pay for your medications:Yes,  mental health  Release of information consent forms completed and in the chart;  Patient's signature needed at discharge.  Patient to Follow up at: Follow-up Information   Follow up with [facility]. (Walk in between 8AM-9AM Monday through Friday for hospital followup/medication management.)    Contact information:   [address], [place] [address] phone: [phone] fax: [phone]      Follow up with [facility] On 09/16/2012. (Appt. at 2:00PM with Elroy Channel for therapy.)    Contact information:   [facility] [address], [place] [address] phone: [phone] fax: [phone]      Patient denies SI/HI:   Yes,  self report    Safety Planning and Suicide Prevention discussed:  Yes,  SPE completed with pt's friend, Lupita Raider and SPI pamphlet given to pt. He was encouraged to ask questions, voice concerns relating to SPE.  Smart, Evella Kasal 09/13/2012, 12:03 PM

## 2012-09-13 NOTE — [facility] Suicide Risk Assessment (Signed)
[facility] INPATIENT:  Family/Significant Other Suicide Prevention Education  Suicide Prevention Education:  Education Completed;[name] [name] (patient's friend) has been identified by the patient as the family member/significant other with whom the patient will be residing, and identified as the person(s) who will aid the patient in the event of a mental health crisis (suicidal ideations/suicide attempt).  With written consent from the patient, the family member/significant other has been provided the following suicide prevention education, prior to the and/or following the discharge of the patient.  The suicide prevention education provided includes the following:  Suicide risk factors  Suicide prevention and interventions  National Suicide Hotline telephone number  [facility] assessment telephone number  [facility] Emergency Assistance 911  [facility] and/or Residential Mobile Crisis Unit telephone number  Request made of family/significant other to:  Remove weapons (e.g., guns, rifles, knives), all items previously/currently identified as safety concern.    Remove drugs/medications (over-the-counter, prescriptions, illicit drugs), all items previously/currently identified as a safety concern.  The family member/significant other verbalizes understanding of the suicide prevention education information provided.  The family member/significant other agrees to remove the items of safety concern listed above.  [name], [name] [name] 09/13/2012, 12:03 PM

## 2012-09-13 NOTE — [facility] Group Notes (Signed)
[facility] LCSW Aftercare Discharge Planning Group Note   09/13/2012 9:39 AM  Participation Quality:  DID NOT ATTEND   Smart, [name] [name]

## 2012-09-13 NOTE — [facility] Suicide Risk Assessment (Signed)
Suicide Risk Assessment  Discharge Assessment     Demographic Factors:  Male and Adolescent or young adult  Mental Status Per Nursing Assessment::   On Admission:  Self-harm thoughts  Current Mental Status by Physician: In full contact with reality. There are no suicidal ideas, plans or intent. His mood is euthymic, his affect is appropriate. He is planning to go to [employer] and look for a culinary job. States he is planning to drink only socially   Loss Factors: NA  Historical Factors: NA  Risk Reduction Factors:   Positive social support  Continued Clinical Symptoms:  Alcohol/Substance Abuse/Dependencies  Cognitive Features That Contribute To Risk:  Closed-mindedness Polarized thinking Thought constriction (tunnel vision)    Suicide Risk:  Minimal: No identifiable suicidal ideation.  Patients presenting with no risk factors but with morbid ruminations; may be classified as minimal risk based on the severity of the depressive symptoms  Discharge Diagnoses:   AXIS I:  Mood disorder NOS, GAD, substance abuse AXIS II:  Deferred AXIS III:   Past Medical History  Diagnosis Date  . Asthma   . Depression   . Schizophrenia   . Bipolar 1 disorder   . Borderline personality disorder    AXIS IV:  occupational problems and other psychosocial or environmental problems AXIS V:  61-70 mild symptoms  Plan Of Care/Follow-up recommendations:  Activity:  as tolerated Diet:  regular Follow up outpatient basis Is patient on multiple antipsychotic therapies at discharge:  No   Has Patient had three or more failed trials of antipsychotic monotherapy by history:  No  Recommended Plan for Multiple Antipsychotic Therapies: N/A   Fahima Cifelli A 09/13/2012, 12:50 PM

## 2012-09-13 NOTE — Progress Notes (Signed)
Discharge note: Pt denies SI/HI. Pt reports that he always hear voices and see images. Pt received verbal and written discharge instructions. Pt agreed to f/u appt and med regimen. Pt received sample meds, prescriptions and a bus pass. Pt received all belongings from room and locker. Pt reports that overall his treatment here was good. Per pt, "that psychiatrist was horrible, he called me liar". During discharge, pt was fidgety, had difficulty focusing, childlike, silly and attention seeking. Writer had to redirect pt several times to get his attention. Pt safely left [facility].

## 2012-09-16 NOTE — Progress Notes (Signed)
Patient Discharge Instructions:  After Visit Summary (AVS):   Faxed to:  09/16/12 Discharge Summary Note:   Faxed to:  09/16/12 Psychiatric Admission Assessment Note:   Faxed to:  09/16/12 Suicide Risk Assessment - Discharge Assessment:   Faxed to:  09/16/12 Faxed/Sent to the Next Level Care provider:  09/16/12 Faxed to [facility] @ [phone] Faxed to [facility] @ [phone]  Jerelene Redden, 09/16/2012, 2:39 PM

## 2012-10-04 ENCOUNTER — Encounter (HOSPITAL_COMMUNITY): Payer: Self-pay | Admitting: *Deleted

## 2012-10-17 ENCOUNTER — Emergency Department (HOSPITAL_COMMUNITY)
Admission: EM | Admit: 2012-10-17 | Discharge: 2012-10-17 | Payer: Self-pay | Attending: Emergency Medicine | Admitting: Emergency Medicine

## 2012-10-17 DIAGNOSIS — R05 Cough: Secondary | ICD-10-CM

## 2012-10-17 DIAGNOSIS — Y939 Activity, unspecified: Secondary | ICD-10-CM | POA: Insufficient documentation

## 2012-10-17 DIAGNOSIS — F17200 Nicotine dependence, unspecified, uncomplicated: Secondary | ICD-10-CM | POA: Insufficient documentation

## 2012-10-17 DIAGNOSIS — T148XXA Other injury of unspecified body region, initial encounter: Secondary | ICD-10-CM

## 2012-10-17 DIAGNOSIS — IMO0002 Reserved for concepts with insufficient information to code with codable children: Secondary | ICD-10-CM | POA: Insufficient documentation

## 2012-10-17 DIAGNOSIS — R059 Cough, unspecified: Secondary | ICD-10-CM | POA: Insufficient documentation

## 2012-10-17 DIAGNOSIS — Z8659 Personal history of other mental and behavioral disorders: Secondary | ICD-10-CM | POA: Insufficient documentation

## 2012-10-17 DIAGNOSIS — Y929 Unspecified place or not applicable: Secondary | ICD-10-CM | POA: Insufficient documentation

## 2012-10-17 DIAGNOSIS — X58XXXA Exposure to other specified factors, initial encounter: Secondary | ICD-10-CM | POA: Insufficient documentation

## 2012-10-17 MED ORDER — ALBUTEROL SULFATE HFA 108 (90 BASE) MCG/ACT IN AERS: 1.0000 | INHALATION_SPRAY | Freq: Four times a day (QID) | RESPIRATORY_TRACT | Status: AC | PRN

## 2012-10-17 MED ORDER — NAPROXEN 500 MG PO TABS: 500.0000 mg | ORAL_TABLET | Freq: Two times a day (BID) | ORAL | Status: AC

## 2012-10-17 MED ORDER — IBUPROFEN 800 MG PO TABS
800.0000 mg | ORAL_TABLET | Freq: Once | ORAL | Status: AC
  Administered 2012-10-17: 800 mg via ORAL
  Filled 2012-10-17: qty 1

## 2012-10-17 NOTE — ED Notes (Signed)
EMS- he does not feels good. Complaints of respiratory distress. No obvious distress noted per EMS- patient thinks he has bronchitis, post nasal drip  Has been going on for past two weeks. Vs- 118/60 84hr 16 rr, hx- bipolar, claoms he has albuterol inhaler that he has lost.

## 2012-10-17 NOTE — ED Provider Notes (Signed)
Medical screening examination/treatment/procedure(s) were conducted as a shared visit with non-physician practitioner(s) and myself.  I personally evaluated the patient during the encounter.  I saw this patient last night, when he reported he was [name] [name], 04/02/1988 birthdate.  I and registration recognized him.  Pt was requesting drug and alcohol rehab.  Pt reports he does not remember that visit at all-he is wearing the same clothes, glasses, and hat from last night.  Olivia Mackie, MD 10/17/12 [phone]

## 2012-10-17 NOTE — ED Provider Notes (Signed)
CSN: [id number]     Arrival date & time 10/17/12  0436 History   First MD Initiated Contact with Patient 10/17/12 0445     Chief Complaint  Patient presents with  . Bronchitis    has postnal drip   HPI  History provided by the patient. Patient is a 22 year old male who reports a past history of bipolar disorder, depression and asthma with previous bronchitis infection presenting with complaints of cough, shortness of breath and wheezing. Patient states he was walking early this morning along [address] when he suddenly became slightly short of breath with wheezing sensation. Symptoms were associated with some coughing fits and small productive phlegm. Patient stopped and rested and felt better. He then called 911 and was transported by EMS. Patient states he thinks he may have bronchitis like in the past. Patient states he used to have an albuterol inhaler but no longer has one. Patient additionally complains of some back soreness has been present for the past 2 weeks. He denies any specific injury or trauma. Symptoms are worse when he first begins to stand or move at the back. He denies any radiating pain. No weakness or numbness no extremity. No urinary or fecal incontinence, urinary retention or perineal numbness. He has not taken any medications for this.    Past Medical History  Diagnosis Date  . Bipolar affect, depressed   . Depression   . Suicidal behavior    No past surgical history on file. No family history on file. History  Substance Use Topics  . Smoking status: Current Every Day Smoker -- 0.50 packs/day    Types: Cigarettes  . Smokeless tobacco: Never Used  . Alcohol Use: No    Review of Systems  Constitutional: Negative for fever and chills.  HENT: Negative for congestion and rhinorrhea.   Respiratory: Positive for cough, shortness of breath and wheezing.   Gastrointestinal: Negative for nausea, vomiting, abdominal pain, diarrhea and constipation.  All other  systems reviewed and are negative.    Allergies  Effexor; Haldol; Neurontin; and Penicillins  Home Medications  No current outpatient prescriptions on file. BP 124/76  Pulse 81  Temp(Src) 98 F (36.7 C) (Oral)  Resp 18  SpO2 100% Physical Exam  Nursing note and vitals reviewed. Constitutional: He is oriented to person, place, and time. He appears well-developed and well-nourished.  HENT:  Head: Normocephalic.  Mouth/Throat: Oropharynx is clear and moist.  Eyes: Conjunctivae are normal.  Cardiovascular: Normal rate and regular rhythm.   Pulmonary/Chest: Effort normal and breath sounds normal. No respiratory distress. He has no wheezes. He has no rales. He exhibits no tenderness.  Abdominal: Soft. There is no tenderness. There is no rebound.  Musculoskeletal: Normal range of motion. He exhibits no edema and no tenderness.       Lumbar back: He exhibits no bony tenderness, no edema and no deformity.       Back:  Neurological: He is alert and oriented to person, place, and time.  Skin: Skin is warm.  Psychiatric: He has a normal mood and affect. His behavior is normal.    ED Course  Procedures     MDM   1. Cough   2. Muscle strain      4:40 AM patient seen and evaluated. Patient appears well in no acute distress. He has normal respirations and O2 sats. Lungs are clear bilaterally with good air movement.  Registrations informed me that patient was here last night under a different name and  birth date. He was being seen for request for alcohol detox however his alcohol level was 0. He gave it very untruthful history during that visit. His story today is also questionable.   He appears well. He has good movements and ambulation. Does not appear in any significant distress or discomfort. He denies any psychiatric complaints. No SI. At this time he is stable for discharge. I will provide a prescription for an albuterol inhaler.   Angus Seller, PA-C 10/17/12 [phone]

## 2012-10-18 ENCOUNTER — Encounter (HOSPITAL_COMMUNITY): Payer: Self-pay | Admitting: *Deleted

## 2013-04-07 IMAGING — CT CT HEAD W/O CM
1 series · 16 of 30 positions shown, 20 images · non-contrast
Comparison: 06/07/2010

CLINICAL DATA: Altered mental status.  Suicidal ideation.

CT HEAD WITHOUT CONTRAST
TECHNIQUE: Contiguous axial images were obtained from the base of
the skull through the vertex without contrast

[Series 3: head routine 4.8 h37s · axial · 0.47mm/px · z∈[+107,+235]mm · 16 of 30 slices shown, 20 images]
[im 2/30  brain]
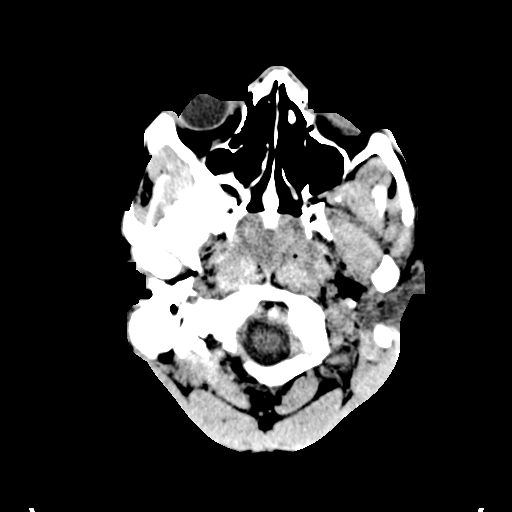
[im 2/30  bone]
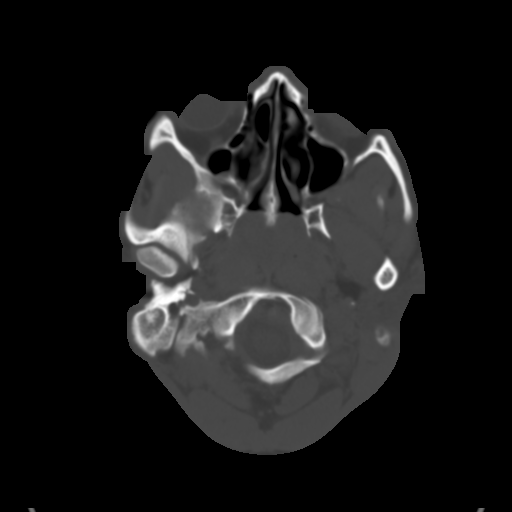
[im 4/30  brain]
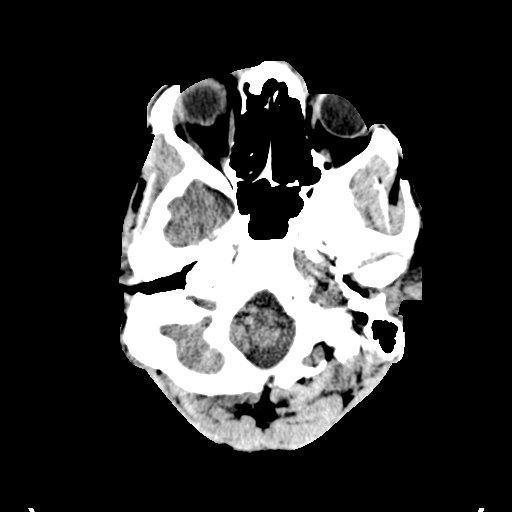
[im 6/30  brain]
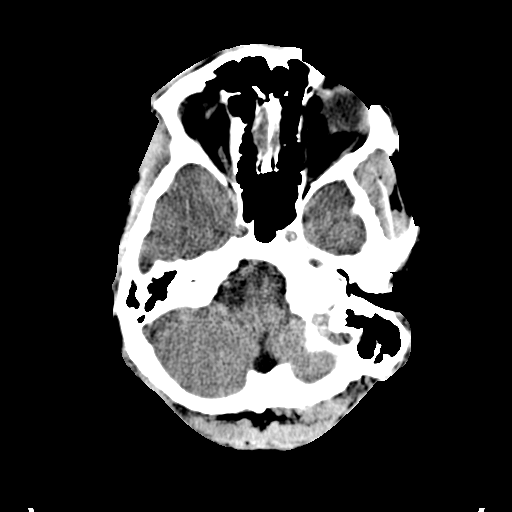
[im 8/30  brain]
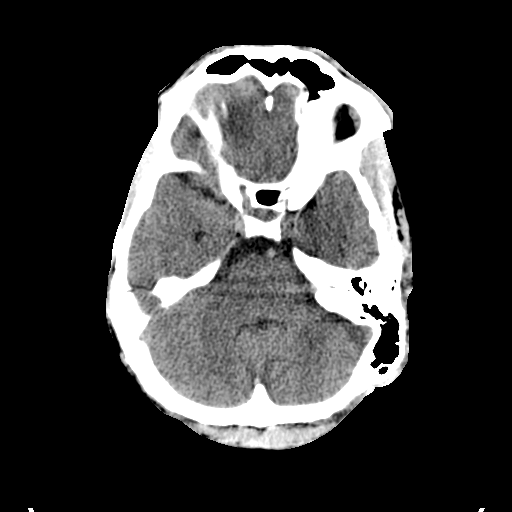
[im 9/30  brain]
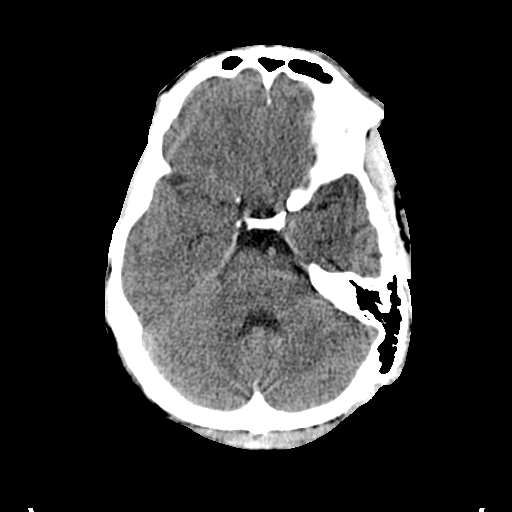
[im 9/30  bone]
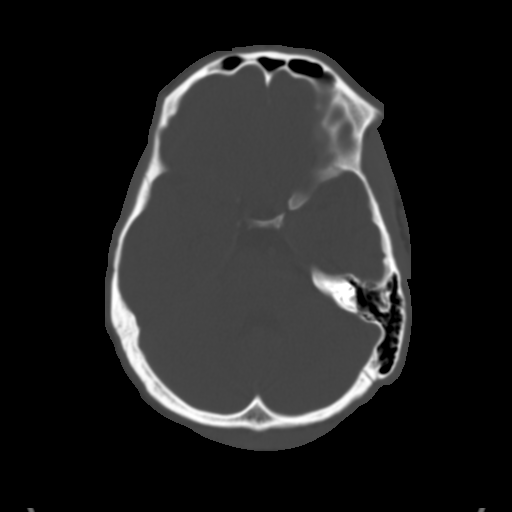
[im 11/30  brain]
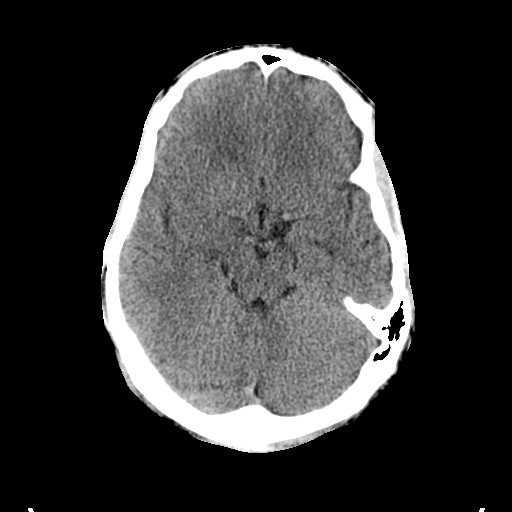
[im 13/30  brain]
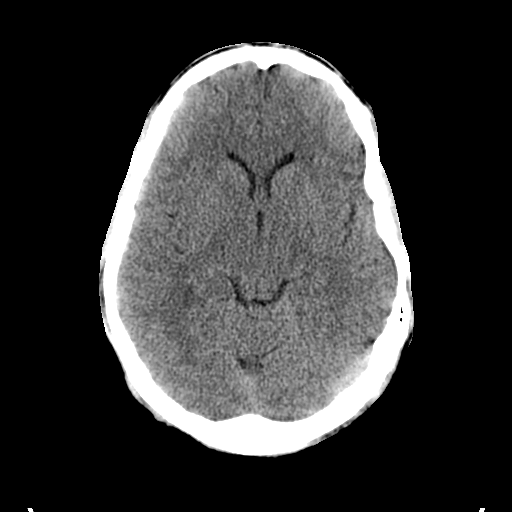
[im 15/30  brain]
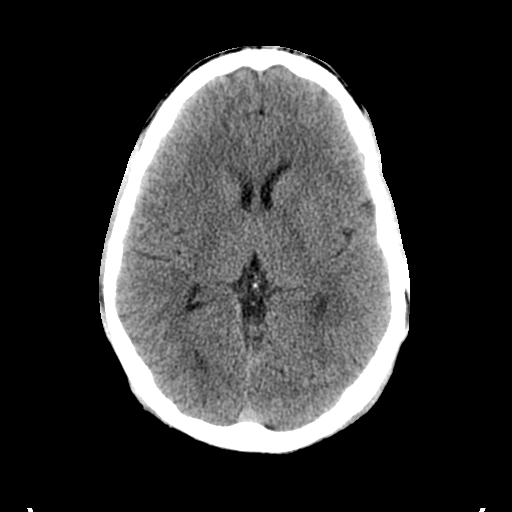
[im 16/30  brain]
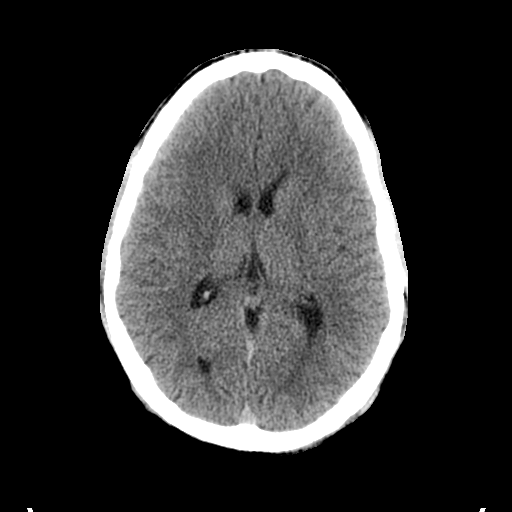
[im 16/30  bone]
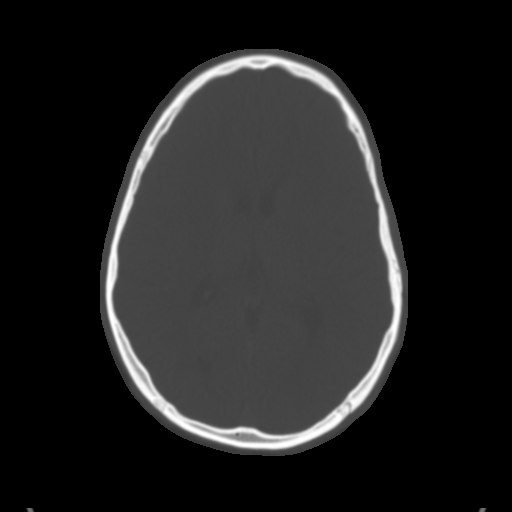
[im 18/30  brain]
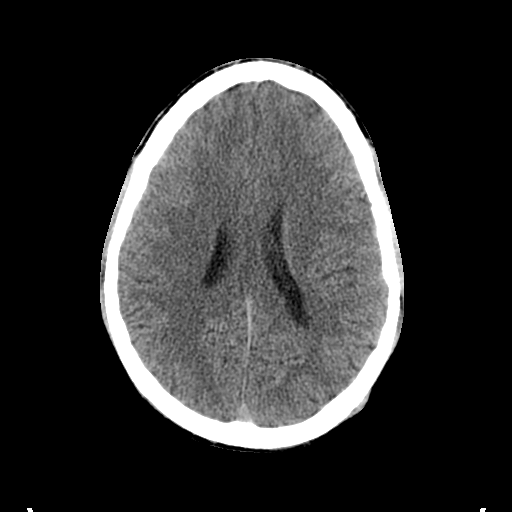
[im 20/30  brain]
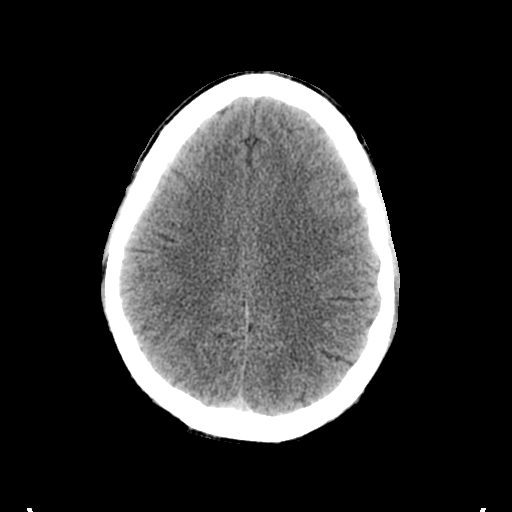
[im 22/30  brain]
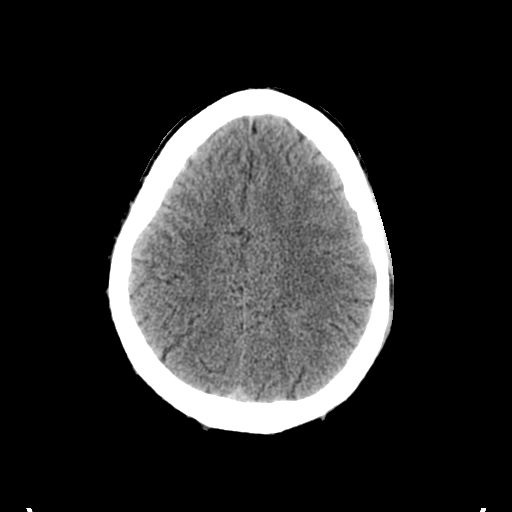
[im 23/30  brain]
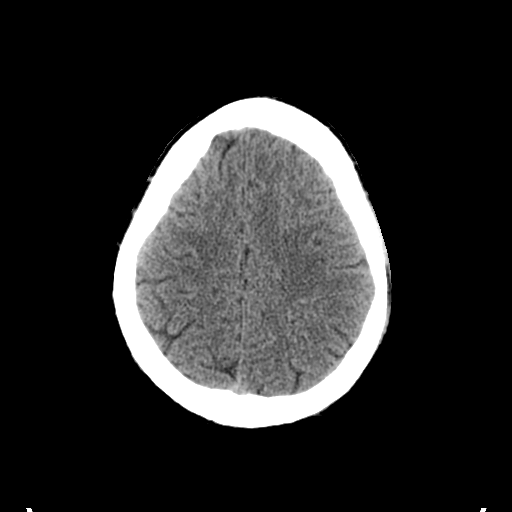
[im 23/30  bone]
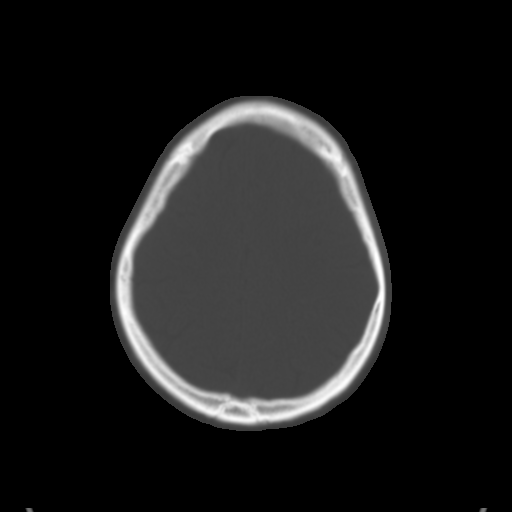
[im 25/30  brain]
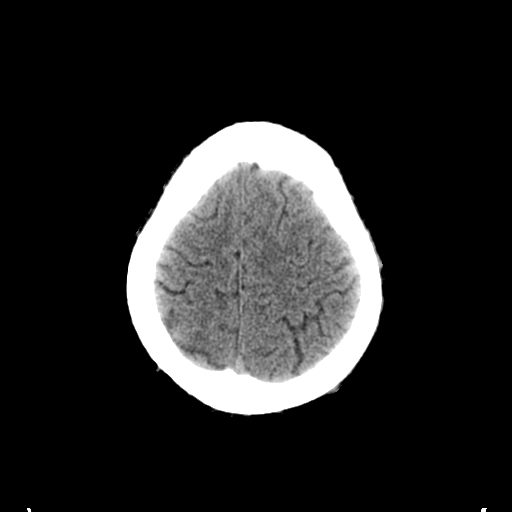
[im 27/30  brain]
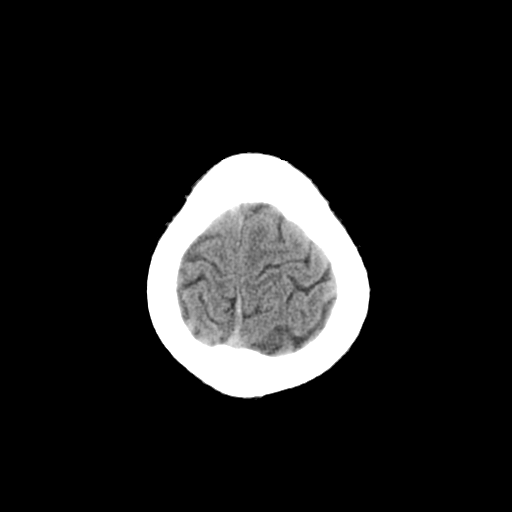
[im 29/30  brain]
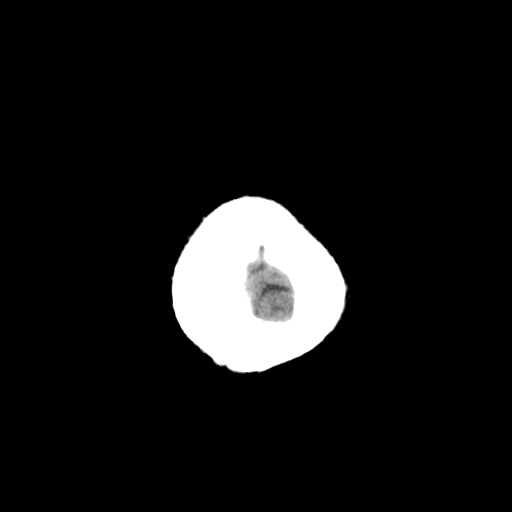

[16 of 30 positions shown; findings below may reference images not displayed]

FINDINGS: There is no evidence of intracranial hemorrhage, brain
edema, or other signs of acute infarction.  There is no evidence of
intracranial mass lesion or mass effect.  No abnormal extraaxial
fluid collections are identified.  There is no evidence of
hydrocephalus, or other significant intracranial abnormality.  No
skull abnormality identified.
IMPRESSION: Negative non-contrast head CT.

## 2013-04-11 IMAGING — CT CT HEAD W/O CM
1 of 2 series · 13 of 30 positions shown, 17 images · non-contrast
Comparison: CT of the head performed 10/05/2010

CLINICAL DATA: Seizure; altered mental status and headache.

CT HEAD WITHOUT CONTRAST
TECHNIQUE: Contiguous axial images were obtained from the base of
the skull through the vertex without contrast.

[Series 2: head routine 4.8 h37s · axial · 0.47mm/px · z∈[+1204,+1349]mm · 13 of 36 slices shown, 17 images]
[im 3/36  brain]
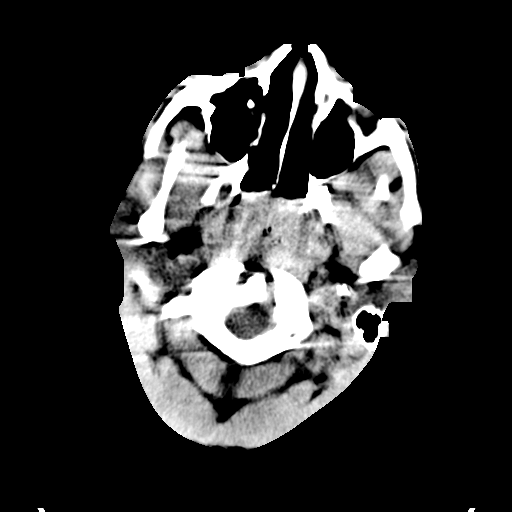
[im 3/36  bone]
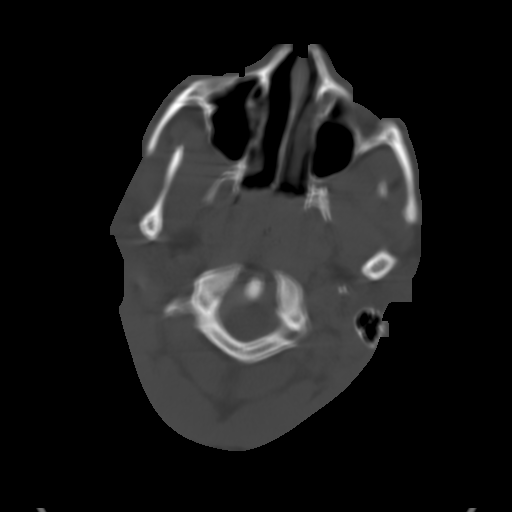
[im 6/36  brain]
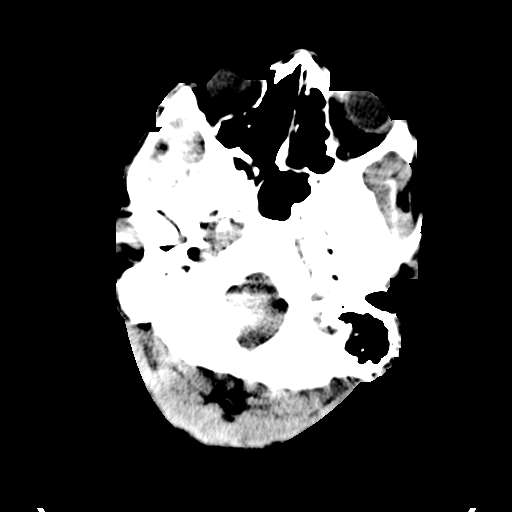
[im 8/36  brain]
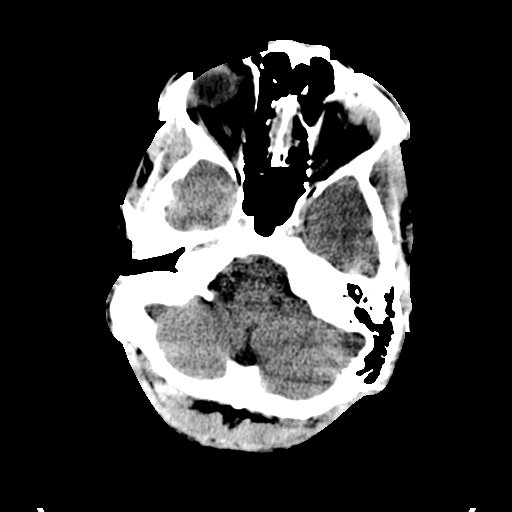
[im 11/36  brain]
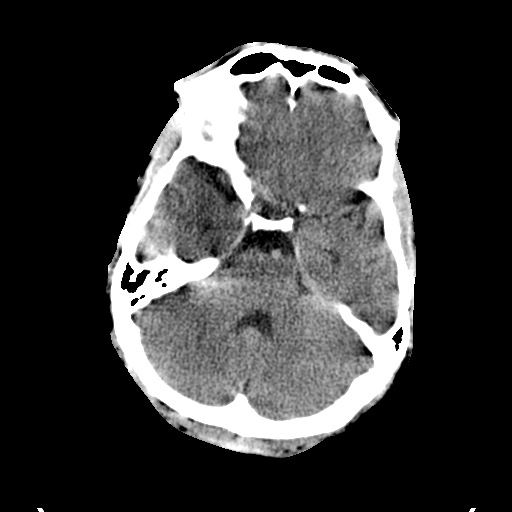
[im 13/36  brain]
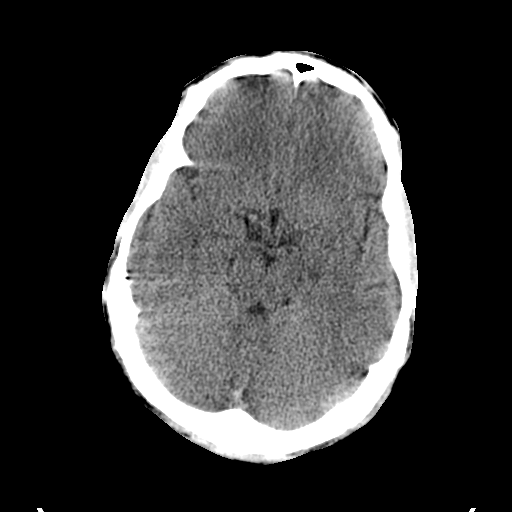
[im 13/36  bone]
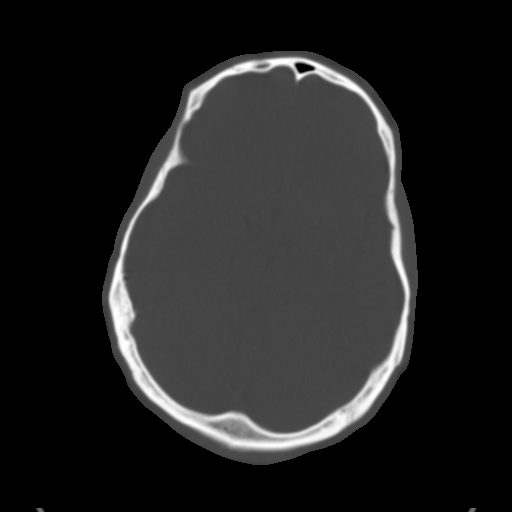
[im 16/36  brain]
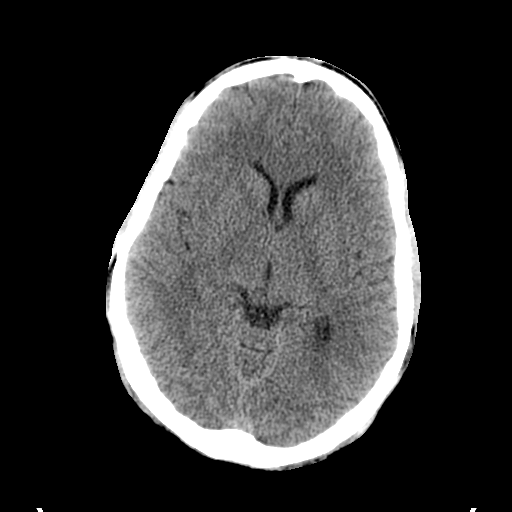
[im 18/36  brain]
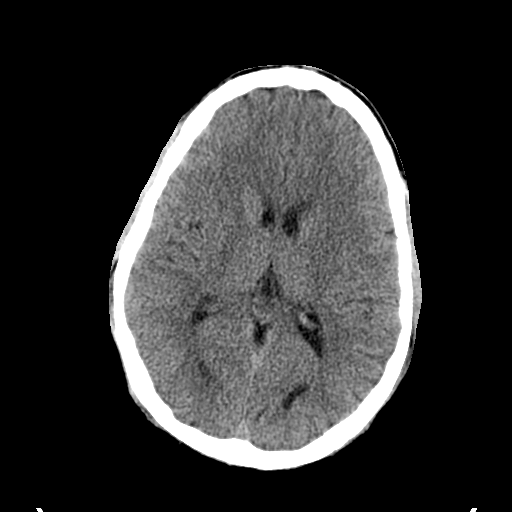
[im 21/36  brain]
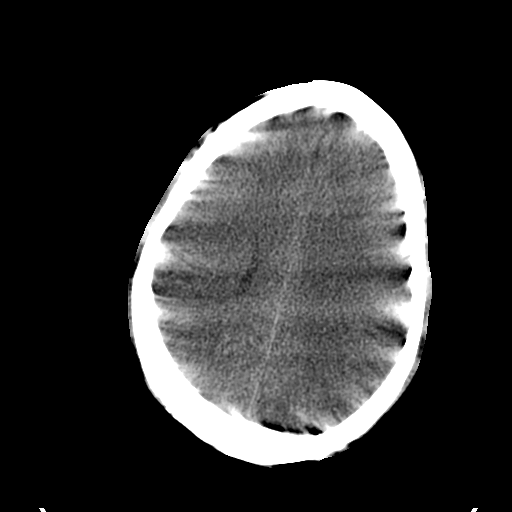
[im 23/36  brain]
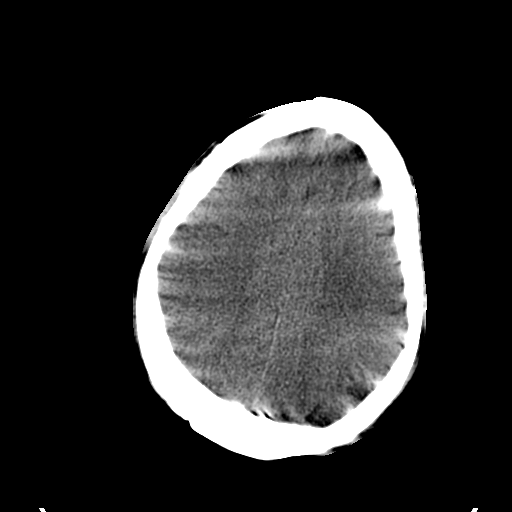
[im 23/36  bone]
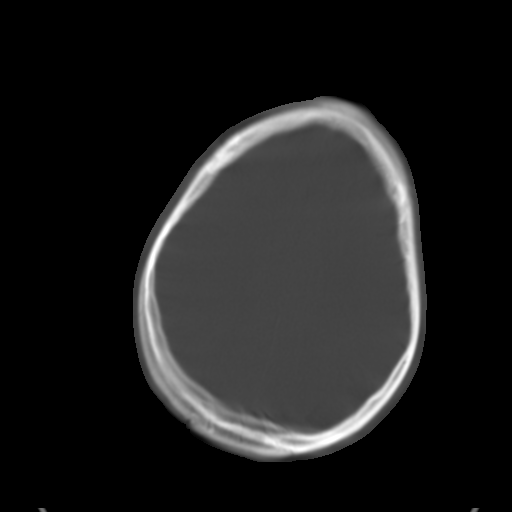
[im 26/36  brain]
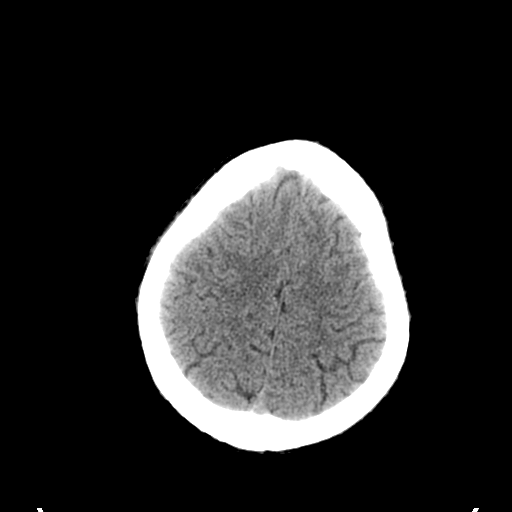
[im 28/36  brain]
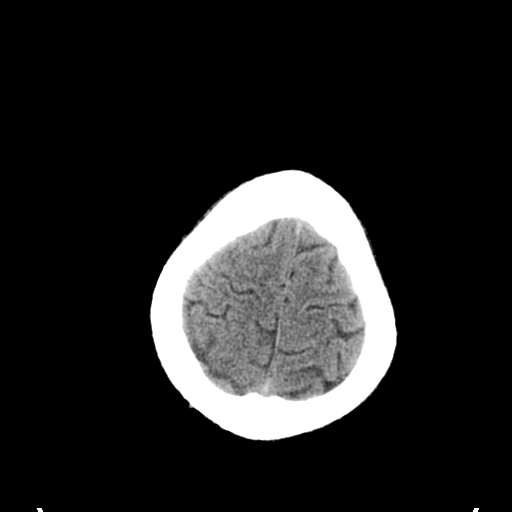
[im 31/36  brain]
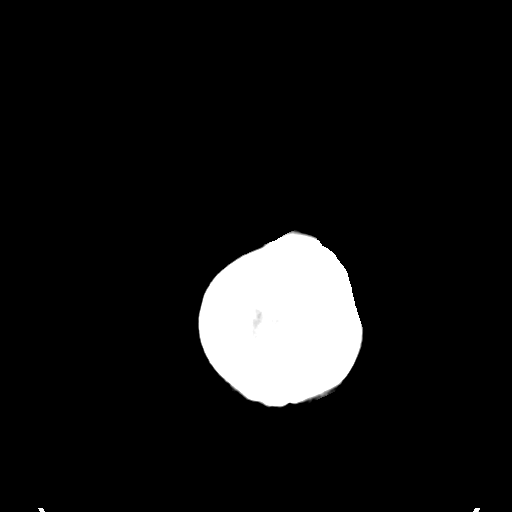
[im 33/36  brain]
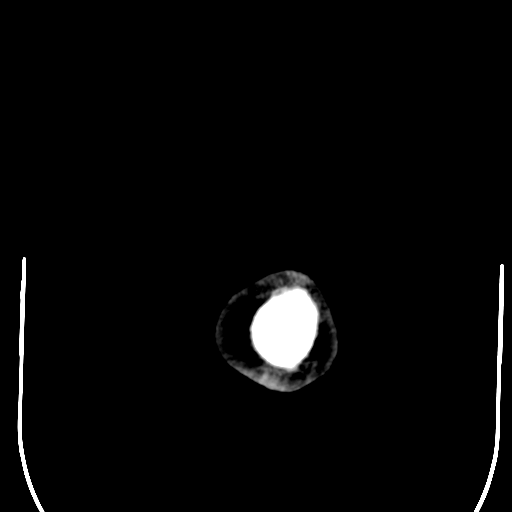
[im 33/36  bone]
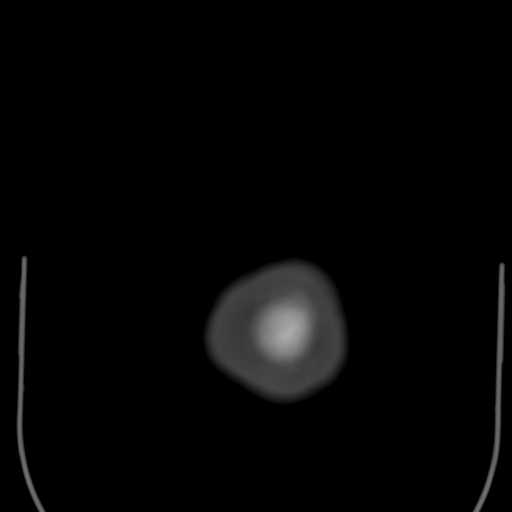

[13 of 30 positions shown; findings below may reference images not displayed]

FINDINGS: There is no evidence of acute infarction, mass lesion, or
intra- or extra-axial hemorrhage on CT.

The posterior fossa, including the cerebellum, brainstem and fourth
ventricle, is within normal limits.  The third and lateral
ventricles, and basal ganglia are unremarkable in appearance.  The
cerebral hemispheres are symmetric in appearance, with normal gray-
white differentiation.  No mass effect or midline shift is seen.

There is no evidence of fracture; visualized osseous structures are
unremarkable in appearance.  The orbits are within normal limits.
The paranasal sinuses and mastoid air cells are well-aerated.  No
significant soft tissue abnormalities are seen.
IMPRESSION: Unremarkable noncontrast CT of the head.

## 2013-07-16 ENCOUNTER — Encounter (HOSPITAL_COMMUNITY): Payer: Self-pay | Admitting: Emergency Medicine

## 2013-07-16 ENCOUNTER — Emergency Department (HOSPITAL_COMMUNITY): Payer: Self-pay

## 2013-07-16 ENCOUNTER — Emergency Department (HOSPITAL_COMMUNITY)
Admission: EM | Admit: 2013-07-16 | Discharge: 2013-07-16 | Disposition: A | Discharge status: Home / Self Care | Payer: Self-pay | Attending: Emergency Medicine | Admitting: Emergency Medicine

## 2013-07-16 DIAGNOSIS — Z888 Allergy status to other drugs, medicaments and biological substances status: Secondary | ICD-10-CM | POA: Insufficient documentation

## 2013-07-16 DIAGNOSIS — F329 Major depressive disorder, single episode, unspecified: Secondary | ICD-10-CM | POA: Insufficient documentation

## 2013-07-16 DIAGNOSIS — M25561 Pain in right knee: Secondary | ICD-10-CM

## 2013-07-16 DIAGNOSIS — F3289 Other specified depressive episodes: Secondary | ICD-10-CM | POA: Insufficient documentation

## 2013-07-16 DIAGNOSIS — F319 Bipolar disorder, unspecified: Secondary | ICD-10-CM | POA: Insufficient documentation

## 2013-07-16 DIAGNOSIS — Z880 Allergy status to penicillin: Secondary | ICD-10-CM | POA: Insufficient documentation

## 2013-07-16 DIAGNOSIS — F1910 Other psychoactive substance abuse, uncomplicated: Secondary | ICD-10-CM | POA: Insufficient documentation

## 2013-07-16 DIAGNOSIS — Z79899 Other long term (current) drug therapy: Secondary | ICD-10-CM | POA: Insufficient documentation

## 2013-07-16 DIAGNOSIS — J45909 Unspecified asthma, uncomplicated: Secondary | ICD-10-CM | POA: Insufficient documentation

## 2013-07-16 DIAGNOSIS — F209 Schizophrenia, unspecified: Secondary | ICD-10-CM | POA: Insufficient documentation

## 2013-07-16 DIAGNOSIS — F3130 Bipolar disorder, current episode depressed, mild or moderate severity, unspecified: Secondary | ICD-10-CM | POA: Insufficient documentation

## 2013-07-16 DIAGNOSIS — F200 Paranoid schizophrenia: Secondary | ICD-10-CM | POA: Insufficient documentation

## 2013-07-16 DIAGNOSIS — R569 Unspecified convulsions: Secondary | ICD-10-CM | POA: Insufficient documentation

## 2013-07-16 DIAGNOSIS — F603 Borderline personality disorder: Secondary | ICD-10-CM | POA: Insufficient documentation

## 2013-07-16 DIAGNOSIS — M25569 Pain in unspecified knee: Secondary | ICD-10-CM | POA: Insufficient documentation

## 2013-07-16 DIAGNOSIS — F489 Nonpsychotic mental disorder, unspecified: Secondary | ICD-10-CM | POA: Insufficient documentation

## 2013-07-16 DIAGNOSIS — F909 Attention-deficit hyperactivity disorder, unspecified type: Secondary | ICD-10-CM | POA: Insufficient documentation

## 2013-07-16 DIAGNOSIS — F4310 Post-traumatic stress disorder, unspecified: Secondary | ICD-10-CM | POA: Insufficient documentation

## 2013-07-16 DIAGNOSIS — F17200 Nicotine dependence, unspecified, uncomplicated: Secondary | ICD-10-CM | POA: Insufficient documentation

## 2013-07-16 MED ORDER — IBUPROFEN 400 MG PO TABS
800.0000 mg | ORAL_TABLET | Freq: Once | ORAL | Status: AC
  Administered 2013-07-16: 800 mg via ORAL
  Filled 2013-07-16: qty 2

## 2013-07-16 MED ORDER — IBUPROFEN 800 MG PO TABS: 800.0000 mg | ORAL_TABLET | Freq: Three times a day (TID) | ORAL | Status: AC

## 2013-07-16 MED ORDER — HYDROCODONE-ACETAMINOPHEN 5-325 MG PO TABS: 1.0000 | ORAL_TABLET | ORAL | Status: AC | PRN

## 2013-07-16 MED ORDER — HYDROCODONE-ACETAMINOPHEN 5-325 MG PO TABS
1.0000 | ORAL_TABLET | Freq: Once | ORAL | Status: AC
  Administered 2013-07-16: 1 via ORAL
  Filled 2013-07-16: qty 1

## 2013-07-16 NOTE — ED Notes (Signed)
The pt is c/o rt knee pain he has had for years.  Worse for the past 2 days.  No recent history
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
## 2013-07-16 NOTE — Discharge Instructions (Signed)
Use RICE method - see below  Ibuprofen - use to help with inflammation and swelling  Vicodin - for severe pain - Please be careful with this medication.  It can cause drowsiness.  Use caution while driving, operating machinery, drinking alcohol, or any other activities that may impair your physical or mental abilities.   Return to the emergency department if you develop any changing/worsening condition, leg swelling, fever, joint swelling/redness or any other concerns (please read additional information regarding your condition below)    Knee Pain Knee pain can be a result of an injury or other medical conditions. Treatment will depend on the cause of your pain. HOME CARE  Only take medicine as told by your doctor.  Keep a healthy weight. Being overweight can make the knee hurt more.  Stretch before exercising or playing sports.  If there is constant knee pain, change the way you exercise. Ask your doctor for advice.  Make sure shoes fit well. Choose the right shoe for the sport or activity.  Protect your knees. Wear kneepads if needed.  Rest when you are tired. GET HELP RIGHT AWAY IF:   Your knee pain does not stop.  Your knee pain does not get better.  Your knee joint feels hot to the touch.  You have a fever. MAKE SURE YOU:   Understand these instructions.  Will watch this condition.  Will get help right away if you are not doing well or get worse. Document Released: 05/02/2008 Document Revised: 04/28/2011 Document Reviewed: 05/02/2008 [facility] Patient Information 2014 [place], [place].  RICE: Routine Care for Injuries The routine care of many injuries includes Rest, Ice, Compression, and Elevation (RICE). HOME CARE INSTRUCTIONS  Rest is needed to allow your body to heal. Routine activities can usually be resumed when comfortable. Injured tendons and bones can take up to 6 weeks to heal. Tendons are the cord-like structures that attach muscle to bone.  Ice following  an injury helps keep the swelling down and reduces pain.  Put ice in a plastic bag.  Place a towel between your skin and the bag.  Leave the ice on for 15-20 minutes, 03-04 times a day. Do this while awake, for the first 24 to 48 hours. After that, continue as directed by your caregiver.  Compression helps keep swelling down. It also gives support and helps with discomfort. If an elastic bandage has been applied, it should be removed and reapplied every 3 to 4 hours. It should not be applied tightly, but firmly enough to keep swelling down. Watch fingers or toes for swelling, bluish discoloration, coldness, numbness, or excessive pain. If any of these problems occur, remove the bandage and reapply loosely. Contact your caregiver if these problems continue.  Elevation helps reduce swelling and decreases pain. With extremities, such as the arms, hands, legs, and feet, the injured area should be placed near or above the level of the heart, if possible. SEEK IMMEDIATE MEDICAL CARE IF:  You have persistent pain and swelling.  You develop redness, numbness, or unexpected weakness.  Your symptoms are getting worse rather than improving after several days. These symptoms may indicate that further evaluation or further X-rays are needed. Sometimes, X-rays may not show a small broken bone (fracture) until 1 week or 10 days later. Make a follow-up appointment with your caregiver. Ask when your X-ray results will be ready. Make sure you get your X-ray results. Document Released: 05/18/2000 Document Revised: 04/28/2011 Document Reviewed: 07/05/2010 [facility] Patient Information 2014 [place], [place].  Emergency Department Resource Guide 1) Find a Doctor and Pay Out of Pocket Although you won't have to find out who is covered by your insurance plan, it is a good idea to ask around and get recommendations. You will then need to call the office and see if the doctor you have chosen will accept you as a new  patient and what types of options they offer for patients who are self-pay. Some doctors offer discounts or will set up payment plans for their patients who do not have insurance, but you will need to ask so you aren't surprised when you get to your appointment.  2) Contact Your Local Health Department Not all health departments have doctors that can see patients for sick visits, but many do, so it is worth a call to see if yours does. If you don't know where your local health department is, you can check in your phone book. The CDC also has a tool to help you locate your state's health department, and many state websites also have listings of all of their local health departments.  3) Find a Echo Clinic If your illness is not likely to be very severe or complicated, you may want to try a walk in clinic. These are popping up all over the country in pharmacies, drugstores, and shopping centers. They're usually staffed by nurse practitioners or physician assistants that have been trained to treat common illnesses and complaints. They're usually fairly quick and inexpensive. However, if you have serious medical issues or chronic medical problems, these are probably not your best option.  No Primary Care Doctor: - Call Health Connect at  [phone] - they can help you locate a primary care doctor that  accepts your insurance, provides certain services, etc. - Physician Referral Service- [phone]  Chronic Pain Problems: Organization         Address  Phone   Notes  [facility]  [phone] Patients need to be referred by their primary care doctor.   Medication Assistance: Organization         Address  Phone   Notes  [facility] Medication [facility] [place] [place], [place] [address] [phone] --Must be a resident of [facility] -- Must have NO insurance coverage whatsoever (no Medicaid/ Medicare, etc.) -- The pt. MUST have a primary  care doctor that directs their care regularly and follows them in the community   MedAssist  [phone]   [employer]  [phone]    Agencies that provide inexpensive medical care: Organization         Address  Phone   Notes  [place]  [phone]   [facility]    [phone]   [facility] [place], [place] [address] [phone]   [place] [address] [place] ([phone]   Planned Parenthood    ([phone]   [facility]    [phone]   [place] and [address], [place] Phone:  [phone], Fax:  [phone] Hours of Operation:  9 am - 6 pm, M-F.  Also accepts Medicaid/Medicare and self-pay.  [facility] [place], [address], [place] Phone: [phone], Fax: [phone]. Hours of Operation:  8:30 am - 5:30 pm, M-F.  Also accepts Medicaid and self-pay.  [facility] [place]  [address] [employer] Phone: [phone]   [place], [place], [place] [phone], Ext. [phone] Mondays & Thursdays: 7-9 AM.  First 15 patients are seen on a first come, first serve basis.    [place] Providers:  Organization         Address  Phone   Notes  [facility] [place] [address], [place] [phone] Also accepts self-pay patients.  [facility] [address], [place]  [phone]   [place], [address], [place] [phone]   [facility] [address] [place] [phone]   Lucianne Lei [address], [place]   [phone] Only accepts [place] Access [place] patients after they have their name applied to their card.   Self-Pay (no insurance) in [facility]:  Organization         Address  Phone   Notes  Sickle Cell Patients, [facility] [place] [phone]   [facility] [place] [phone]   [facility] [place]  [place], [place], [place] [phone]   [facility]/Dr. Osei-Bonsu  [address] [place] or [address], [place] [phone] Phone number for both [place] and [place] locations is the same.  [facility] [address] [place] [phone]   [facility] [address], [place] or [address] [phone]   [facility] [address], [place] [phone], phone; [phone], fax Sees patients 1st and 3rd Saturday of every month.  Must not qualify for public or private insurance (i.e. Medicaid, Medicare, [place] Health Choice, Veterans' Benefits)  Household income should be no more than 200% of the poverty level The clinic cannot treat you if you are pregnant or think you are pregnant  Sexually transmitted diseases are not treated at the clinic.    Dental Care: Organization         Address  Phone  Notes  [facility] Department of [facility] [place] [phone] Accepts children up to age 39 who are enrolled in [place] or [place]; pregnant women with a Medicaid card; and children who have applied for Medicaid or [place] Health Choice, but were declined, whose parents can pay a reduced fee at time of service.  [facility] Department of [facility]  [address], [place] [phone] Accepts children up to age 82 who are enrolled in [place] or [place]; pregnant women with a Medicaid card; and children who have applied for Medicaid or [place] Health Choice, but were declined, whose parents can  pay a reduced fee at time of service.  [place] Adult Dental Access PROGRAM  [place] [phone] Patients are seen by appointment only. Walk-ins are not accepted. [place] will see patients 34 years of age and older. Monday - Tuesday (8am-5pm) Most Wednesdays (8:30-5pm) $30 per visit, cash only  [facility] Adult Dental Access PROGRAM  [address], [facility] [phone] Patients are seen by appointment only. Walk-ins are not accepted. [place] will see patients 40 years of age and older.  One Wednesday Evening (Monthly: Volunteer Based).  $30 per visit, cash only  [place]  [phone] for adults; Children under age 49, call Graduate Pediatric Dentistry at [phone]. Children aged 15-14, please call [phone] to request a pediatric application.  Dental services are provided in all areas of dental care including fillings, crowns and bridges, complete and partial dentures, implants, gum treatment, root canals, and extractions. Preventive care is also provided. Treatment is provided to both adults and children. Patients are selected via a lottery and there is often a waiting list.   [facility] [address] [place]  [phone] [email or web address]   [facility] [address] [place], [place] [phone], Ext. [phone] Second and Fourth Thursday of each month, opens at 6:30 AM; Clinic ends at 9 AM.  Patients are seen on a first-come first-served basis, and a limited number are seen during each clinic.   [facility]  [address] Hillard Danker [place], [place] [phone]   Eligibility Requirements You must have lived in [place], [place], or [place] counties for at least the last three months.   You cannot be eligible for state or federal sponsored [employer], including [employer], [place], or [employer].   You generally cannot be eligible for healthcare  insurance through your employer.    How to apply: Eligibility screenings are held every Tuesday and Wednesday afternoon from 1:00 pm until 4:00 pm. You do not need an appointment for the interview!  [facility] [address] [place], [place]   [place]  [place] Department  [place]  [phone]    Behavioral Health Resources in the Community: Intensive Outpatient Programs Organization         Address  Phone  Notes  [place], [place], [place] [phone]   [facility] [address], [place], [place]   ADS: Alcohol & Drug Svcs [address], [place], [place]   [place] [address],  [place], [place] or [phone]   Substance Abuse Resources Organization         Address  Phone  Notes  Alcohol and Drug Services  [phone]   [place]  [phone]   The [place]   Chinita Pester  [phone]   Residential & Outpatient Substance Abuse Program  [phone]   Psychological Services Organization         Address  Phone  Notes  [facility] [place]  [place]  [phone]   [place] [address], [place] or [phone]    Mobile Crisis Teams Organization         Address  Phone  Notes  [facility], Mobile Crisis Care Unit  [phone]   [facility]  [address] [place], [place]   [place] [address], [place] [phone]    Self-Help/Support Groups Organization         Address  Phone             Notes  [place]. of [place] - variety of support groups  [place] Call for more information  Narcotics Anonymous (NA),  [facility] [address], [employer]  2 meetings at this  location   Residential Treatment Programs Organization         Address  Phone  Notes  [facility] [address]    [place]  [phone]   [facility]  [address] [place] [phone], [place], [place]   [place], [place] [phone] Admissions: 8am-3pm M-F  Incentives Substance [place] [address]    [place], [place] [phone]   [facility] [address] [place], [place], [place]   [facility] [address],  [place], [place]   [facility] - Intensive Outpatient [address], [place], [place]   [facility] ([place] [place].,  [place], [place] [phone] or [phone]   [facility] Services (RTS) [address], [place], [place] Accepts Medicaid  [facility] [address],  [place] [phone] Substance Abuse/Addiction Treatment   [facility] Organization         Address  Phone  Notes  [facility]  [phone]   Domenic Schwab, PhD [address] Arlis Porta [place], [place]   [phone] or [phone]   [place], [place] [phone]   [facility] [address] [place], [place] [phone] Insurance/Medicaid/sponsorship through [facility] and Families [address]                                    [place], [place] [phone] [place] [address], [place] [phone]    Dr. Adele Schilder  [phone]   [facility] Dept. 1) [address], [place] Walk 2) [place] 3)  [place] [address], [place] [phone]  [phone]    [place] [phone] or [phone] (After Hours)

## 2013-07-25 NOTE — ED Provider Notes (Signed)
Medical screening examination/treatment/procedure(s) were performed by non-physician practitioner and as supervising physician I was immediately available for consultation/collaboration.   EKG Interpretation None        Vanetta Mulders, MD 07/25/13 1520

## 2014-06-18 DIAGNOSIS — R55 Syncope and collapse: Secondary | ICD-10-CM

## 2014-06-18 HISTORY — DX: Syncope and collapse: R55

## 2014-06-21 ENCOUNTER — Observation Stay (HOSPITAL_COMMUNITY): Payer: Self-pay

## 2014-06-21 ENCOUNTER — Emergency Department (HOSPITAL_BASED_OUTPATIENT_CLINIC_OR_DEPARTMENT_OTHER): Payer: Self-pay

## 2014-06-21 ENCOUNTER — Encounter (HOSPITAL_COMMUNITY): Payer: Self-pay

## 2014-06-21 ENCOUNTER — Emergency Department (HOSPITAL_COMMUNITY): Payer: Self-pay

## 2014-06-21 ENCOUNTER — Other Ambulatory Visit (HOSPITAL_COMMUNITY): Payer: Self-pay

## 2014-06-21 ENCOUNTER — Observation Stay (HOSPITAL_COMMUNITY)
Admission: EM | Admit: 2014-06-21 | Discharge: 2014-06-23 | Discharge status: Against Medical Advice | Payer: Self-pay | Attending: Internal Medicine | Admitting: Internal Medicine

## 2014-06-21 DIAGNOSIS — F1994 Other psychoactive substance use, unspecified with psychoactive substance-induced mood disorder: Secondary | ICD-10-CM | POA: Diagnosis present

## 2014-06-21 DIAGNOSIS — F1914 Other psychoactive substance abuse with psychoactive substance-induced mood disorder: Secondary | ICD-10-CM | POA: Insufficient documentation

## 2014-06-21 DIAGNOSIS — I82402 Acute embolism and thrombosis of unspecified deep veins of left lower extremity: Secondary | ICD-10-CM

## 2014-06-21 DIAGNOSIS — R55 Syncope and collapse: Principal | ICD-10-CM | POA: Diagnosis present

## 2014-06-21 DIAGNOSIS — F1910 Other psychoactive substance abuse, uncomplicated: Secondary | ICD-10-CM | POA: Diagnosis present

## 2014-06-21 DIAGNOSIS — F909 Attention-deficit hyperactivity disorder, unspecified type: Secondary | ICD-10-CM | POA: Insufficient documentation

## 2014-06-21 DIAGNOSIS — J45909 Unspecified asthma, uncomplicated: Secondary | ICD-10-CM | POA: Insufficient documentation

## 2014-06-21 DIAGNOSIS — F319 Bipolar disorder, unspecified: Secondary | ICD-10-CM | POA: Insufficient documentation

## 2014-06-21 DIAGNOSIS — Z86718 Personal history of other venous thrombosis and embolism: Secondary | ICD-10-CM

## 2014-06-21 DIAGNOSIS — D72829 Elevated white blood cell count, unspecified: Secondary | ICD-10-CM | POA: Diagnosis present

## 2014-06-21 DIAGNOSIS — F200 Paranoid schizophrenia: Secondary | ICD-10-CM | POA: Insufficient documentation

## 2014-06-21 DIAGNOSIS — F4310 Post-traumatic stress disorder, unspecified: Secondary | ICD-10-CM | POA: Insufficient documentation

## 2014-06-21 DIAGNOSIS — R000 Tachycardia, unspecified: Secondary | ICD-10-CM

## 2014-06-21 DIAGNOSIS — D696 Thrombocytopenia, unspecified: Secondary | ICD-10-CM | POA: Insufficient documentation

## 2014-06-21 DIAGNOSIS — F17210 Nicotine dependence, cigarettes, uncomplicated: Secondary | ICD-10-CM | POA: Insufficient documentation

## 2014-06-21 DIAGNOSIS — F603 Borderline personality disorder: Secondary | ICD-10-CM | POA: Insufficient documentation

## 2014-06-21 HISTORY — DX: Syncope and collapse: R55

## 2014-06-21 LAB — CBC WITH DIFFERENTIAL/PLATELET
BASOS PCT: 0 % (ref 0–1)
Basophils Absolute: 0 10*3/uL (ref 0.0–0.1)
EOS PCT: 1 % (ref 0–5)
Eosinophils Absolute: 0.2 10*3/uL (ref 0.0–0.7)
HEMATOCRIT: 40.3 % (ref 39.0–52.0)
Hemoglobin: 13.2 g/dL (ref 13.0–17.0)
Lymphocytes Relative: 13 % (ref 12–46)
Lymphs Abs: 2.8 10*3/uL (ref 0.7–4.0)
MCH: 30.3 pg (ref 26.0–34.0)
MCHC: 32.8 g/dL (ref 30.0–36.0)
MCV: 92.4 fL (ref 78.0–100.0)
MONO ABS: 1.7 10*3/uL — AB (ref 0.1–1.0)
Monocytes Relative: 8 % (ref 3–12)
NEUTROS ABS: 17 10*3/uL — AB (ref 1.7–7.7)
Neutrophils Relative %: 78 % — ABNORMAL HIGH (ref 43–77)
Platelets: 109 10*3/uL — ABNORMAL LOW (ref 150–400)
RBC: 4.36 MIL/uL (ref 4.22–5.81)
RDW: 14.1 % (ref 11.5–15.5)
WBC: 21.7 10*3/uL — AB (ref 4.0–10.5)

## 2014-06-21 LAB — BASIC METABOLIC PANEL
ANION GAP: 4 — AB (ref 5–15)
BUN: 9 mg/dL (ref 6–20)
CALCIUM: 8.7 mg/dL — AB (ref 8.9–10.3)
CO2: 27 mmol/L (ref 22–32)
CREATININE: 0.83 mg/dL (ref 0.61–1.24)
Chloride: 108 mmol/L (ref 101–111)
GFR calc Af Amer: >60 mL/min (ref >60)
GFR calc non Af Amer: >60 mL/min (ref >60)
Glucose, Bld: 85 mg/dL (ref 70–99)
Potassium: 3.9 mmol/L (ref 3.5–5.1)
Sodium: 139 mmol/L (ref 135–145)

## 2014-06-21 LAB — CBC
HCT: 38.5 % — ABNORMAL LOW (ref 39.0–52.0)
HEMOGLOBIN: 13 g/dL (ref 13.0–17.0)
MCH: 30.8 pg (ref 26.0–34.0)
MCHC: 33.8 g/dL (ref 30.0–36.0)
MCV: 91.2 fL (ref 78.0–100.0)
Platelets: 134 10*3/uL — ABNORMAL LOW (ref 150–400)
RBC: 4.22 MIL/uL (ref 4.22–5.81)
RDW: 14.1 % (ref 11.5–15.5)
WBC: 15.2 10*3/uL — AB (ref 4.0–10.5)

## 2014-06-21 LAB — CREATININE, SERUM: Creatinine, Ser: 0.94 mg/dL (ref 0.61–1.24)

## 2014-06-21 LAB — D-DIMER, QUANTITATIVE: D-Dimer, Quant: <0.27 ug/mL-FEU (ref 0.00–0.48)

## 2014-06-21 LAB — TROPONIN I: Troponin I: <0.03 ng/mL (ref <0.031)

## 2014-06-21 MED ORDER — ONDANSETRON HCL 4 MG PO TABS: 4.0000 mg | ORAL_TABLET | Freq: Four times a day (QID) | ORAL | Status: DC | PRN

## 2014-06-21 MED ORDER — ARIPIPRAZOLE 5 MG PO TABS
5.0000 mg | ORAL_TABLET | Freq: Every day | ORAL | Status: DC
  Administered 2014-06-21 – 2014-06-23 (×3): 5 mg via ORAL
  Filled 2014-06-21 (×3): qty 1

## 2014-06-21 MED ORDER — ONDANSETRON HCL 4 MG/2ML IJ SOLN: 4.0000 mg | Freq: Four times a day (QID) | INTRAMUSCULAR | Status: DC | PRN

## 2014-06-21 MED ORDER — POLYETHYLENE GLYCOL 3350 17 G PO PACK
17.0000 g | PACK | Freq: Every day | ORAL | Status: DC | PRN
  Filled 2014-06-21: qty 1

## 2014-06-21 MED ORDER — ALUM & MAG HYDROXIDE-SIMETH 200-200-20 MG/5ML PO SUSP: 30.0000 mL | Freq: Four times a day (QID) | ORAL | Status: DC | PRN

## 2014-06-21 MED ORDER — HEPARIN SODIUM (PORCINE) 5000 UNIT/ML IJ SOLN
5000.0000 [IU] | Freq: Three times a day (TID) | INTRAMUSCULAR | Status: DC
  Administered 2014-06-22: 5000 [IU] via SUBCUTANEOUS
  Filled 2014-06-21 (×6): qty 1

## 2014-06-21 MED ORDER — SODIUM CHLORIDE 0.9 % IJ SOLN
3.0000 mL | Freq: Two times a day (BID) | INTRAMUSCULAR | Status: DC
  Administered 2014-06-22 – 2014-06-23 (×2): 3 mL via INTRAVENOUS

## 2014-06-21 MED ORDER — ASPIRIN 81 MG PO CHEW
81.0000 mg | CHEWABLE_TABLET | Freq: Every day | ORAL | Status: DC
  Administered 2014-06-21 – 2014-06-23 (×3): 81 mg via ORAL
  Filled 2014-06-21 (×3): qty 1

## 2014-06-21 MED ORDER — GUAIFENESIN-DM 100-10 MG/5ML PO SYRP: 5.0000 mL | ORAL_SOLUTION | ORAL | Status: DC | PRN

## 2014-06-21 MED ORDER — HYDROCODONE-ACETAMINOPHEN 5-325 MG PO TABS
1.0000 | ORAL_TABLET | ORAL | Status: DC | PRN
  Administered 2014-06-21: 2 via ORAL
  Filled 2014-06-21: qty 2

## 2014-06-21 NOTE — Consult Note (Signed)
Consult Reason for Consult:syncope  Referring Physician: Dr Thedore MinsSingh  CC: syncope  HPI: [name] [name] is an 24 y.o. male history of bipolar disorder,  schizophrenia, bipolar, ADHD, PTSD,  who presented to [facility] where he was taken from a motel after being found unresponsive. At [facility] he had extensive workup which included an LP with unremarkable CSF, EKG, blood levels which showed chronic leukocytosis, ead CT which was nonacute, a urine drug screen which was positive for amphetamines and cannabinoids, he subsequently signed out AMA and came here.  He notes a history of passing out 10 to 12 times in the past year. Denies any light headed sensation, no palpitations, no graying out of vision prior to LOC. No loss of bowel/bladder. Notes biting his lip during one event. They have all been unwitnessed. His girlfriend notes episodes where he will stare off blankly. No abnormal movements noted during these episodes.    Past Medical History  Diagnosis Date  . Bipolar affect, depressed   . Seizures   . Drug abuse   . Suicidal behavior   . Borderline personality disorder     per pt  . PTSD (post-traumatic stress disorder)     per pt  . ADHD (attention deficit hyperactivity disorder)     per pt  . Paranoid schizophrenia     per pt  . Asthma   . Asthma   . Depression   . Schizophrenia   . Bipolar 1 disorder   . Borderline personality disorder     Past Surgical History  Procedure Laterality Date  . Leg surgery      History reviewed. No pertinent family history.  Social History:  reports that he has been smoking Cigarettes.  He has been smoking about 1.50 packs per day. He does not have any smokeless tobacco history on file. He reports that he drinks alcohol. He reports that he uses illicit drugs (Marijuana) about 4 times per week.  Allergies  Allergen Reactions  . Haldol [Haloperidol Lactate] Other (See Comments)    Seizures   . Penicillins  Anaphylaxis and Hives  . Penicillins Anaphylaxis and Swelling  . Effexor [Venlafaxine Hydrochloride] Other (See Comments)    "bad reaction"  . Effexor [Venlafaxine Hydrochloride] Other (See Comments)    Feeling "not myself"  . Effexor [Venlafaxine] Other (See Comments)    Side effects are really bad for patient  . Haldol [Haloperidol Decanoate] Other (See Comments)    seizure  . Haldol [Haloperidol Lactate] Other (See Comments)    seizure  . Penicillins Swelling    Medications:  Scheduled: . aspirin  81 mg Oral Daily    ROS: Out of a complete 14 system review, the patient complains of only the following symptoms, and all other reviewed systems are negative. Denies any ROS  Physical Examination: Filed Vitals:   06/21/14 1324  BP: 131/66  Pulse: 64  Temp: 98.3 F (36.8 C)  Resp: 18   Physical Exam  Constitutional: He appears well-developed and well-nourished.  Psych: Affect appropriate to situation Eyes: No scleral injection HENT: No OP obstrucion Head: Normocephalic.  Cardiovascular: Normal rate and regular rhythm.  Respiratory: Effort normal and breath sounds normal.  GI: Soft. Bowel sounds are normal. No distension. There is no tenderness.  Skin: WDI  Neurologic Examination Mental Status: Alert, oriented, thought content appropriate.  Speech fluent without evidence of aphasia. No dysarthria. Able to follow 3 step commands without difficulty. Cranial Nerves: II: funduscopic exam wnl bilaterally, visual  fields grossly normal, pupils equal, round, reactive to light and accommodation III,IV, VI: ptosis not present, extra-ocular motions intact bilaterally V,VII: smile symmetric, facial light touch sensation normal bilaterally VIII: hearing normal bilaterally IX,X: gag reflex present XI: trapezius strength/neck flexion strength normal bilaterally XII: tongue strength normal  Motor: Right : Upper extremity    Left:     Upper extremity 5/5 deltoid       5/5  deltoid 5/5 biceps      5/5 biceps  5/5 triceps      5/5 triceps 5/5 hand grip      5/5 hand grip  Lower extremity     Lower extremity 5/5 hip flexor      5/5 hip flexor 5/5 quadricep      5/5 quadriceps  5/5 hamstrings     5/5 hamstrings 5/5 plantar flexion       5/5 plantar flexion 5/5 plantar extension     5/5 plantar extension Tone and bulk:normal tone throughout; no atrophy noted Sensory: Pinprick and light touch intact throughout, bilaterally Deep Tendon Reflexes: 2+ and symmetric throughout Plantars: Right: downgoing   Left: downgoing Cerebellar: normal finger-to-nose, normal rapid alternating movements and normal heel-to-shin test Gait: normal gait and station  Laboratory Studies:   Basic Metabolic Panel:  Recent Labs Lab 06/21/14 1028  NA 139  K 3.9  CL 108  CO2 27  GLUCOSE 85  BUN 9  CREATININE 0.83  CALCIUM 8.7*    Liver Function Tests: No results for input(s): AST, ALT, ALKPHOS, BILITOT, PROT, ALBUMIN in the last 168 hours. No results for input(s): LIPASE, AMYLASE in the last 168 hours. No results for input(s): AMMONIA in the last 168 hours.  CBC:  Recent Labs Lab 06/21/14 1028  WBC 21.7*  NEUTROABS 17.0*  HGB 13.2  HCT 40.3  MCV 92.4  PLT 109*    Cardiac Enzymes: No results for input(s): CKTOTAL, CKMB, CKMBINDEX, TROPONINI in the last 168 hours.  BNP: Invalid input(s): POCBNP  CBG: No results for input(s): GLUCAP in the last 168 hours.  Microbiology: No results found for this or any previous visit.  Coagulation Studies: No results for input(s): LABPROT, INR in the last 72 hours.  Urinalysis: No results for input(s): COLORURINE, LABSPEC, PHURINE, GLUCOSEU, HGBUR, BILIRUBINUR, KETONESUR, PROTEINUR, UROBILINOGEN, NITRITE, LEUKOCYTESUR in the last 168 hours.  Invalid input(s): APPERANCEUR  Lipid Panel:  No results found for: CHOL, TRIG, HDL, CHOLHDL, VLDL, LDLCALC  HgbA1C: No results found for: HGBA1C  Urine Drug Screen:      Component Value Date/Time   LABOPIA NONE DETECTED 09/09/2012 1246   LABOPIA NEGATIVE 01/13/2007 1317   COCAINSCRNUR NONE DETECTED 09/09/2012 1246   COCAINSCRNUR NEGATIVE 01/13/2007 1317   LABBENZ NONE DETECTED 09/09/2012 1246   LABBENZ NEGATIVE 01/13/2007 1317   AMPHETMU NONE DETECTED 09/09/2012 1246   AMPHETMU NEGATIVE 01/13/2007 1317   THCU POSITIVE* 09/09/2012 1246   LABBARB POSITIVE* 09/09/2012 1246    Alcohol Level: No results for input(s): ETH in the last 168 hours.  Other results:  Imaging: Dg Chest 2 View  06/21/2014   CLINICAL DATA:  Pain in the arms, some shortness of breath and chest pain, smoking history  EXAM: CHEST  2 VIEW  COMPARISON:  None.  FINDINGS: No active infiltrate or effusion is seen. Mediastinal and hilar contours are unremarkable. The heart is within normal limits in size. No bony abnormality is seen.  IMPRESSION: No active cardiopulmonary disease.   Electronically Signed   By: Lucienne MinksPaul  Barry M.D.  On: 06/21/2014 13:21     Assessment/Plan: 23y/o gentleman with history of bipolar, schizophrenia, substance abuse presenting with recurrent episodes of LOC. Unclear etiology. Events do not appear typical for seizure but would check EEG. Hold on AED at this point. If EEG unremarkable may benefit from outpatient cardiology follow up for possible Holter or loop recorder. Patient is unable to drive, operate heavy machinery, perform activities at heights or participate in water activities until release by outpatient physician.    Elspeth Cho, DO [facility] [phone]  If 7pm- 7am, please page neurology on call as listed in AMION. 06/21/2014, 1:58 PM

## 2014-06-21 NOTE — ED Notes (Addendum)
Echo ongoing. Will check back for blood draw and Orthostatics.

## 2014-06-21 NOTE — ED Notes (Signed)
He tells me he has had right arm (near anticubital area) and "back pain" "for a while now".  He is strangely evasive in his demeanor and is comfortable in appearance and using his cell phone.

## 2014-06-21 NOTE — ED Notes (Signed)
I have just phoned report to [facility] 3E nurse; and will notify [facility] now.

## 2014-06-21 NOTE — Progress Notes (Signed)
*  PRELIMINARY RESULTS* Vascular Ultrasound Bilateral upper extremity venous duplex has been completed.  Preliminary findings: No evidence of DVT or superficial thrombosis.  Farrel DemarkJill Eunice, RDMS, RVT  06/21/2014, 10:06 AM

## 2014-06-21 NOTE — ED Notes (Signed)
Dr. Deretha EmoryZackowski has just spoken with him to notify him of possible admit, with which he agrees if necessary.

## 2014-06-21 NOTE — ED Notes (Signed)
Pt presents after leaving AMA from [facility], because "it's too far away."  Pt reports that he has blood clots in his L shoulder and R forearm and pain to both areas.  Pain score 8/10.  Pt has AMA paperwork at bedside.  Sts "they stuck make back, did xrays, and a CT."   NAD noted.  Pt does not know why he initially went to [facility].  Sts "I passed out or something."

## 2014-06-21 NOTE — H&P (Addendum)
Patient Demographics  [name] [name], is a 24 y.o. male  MRN: [id number]   DOB - [birth year]  Admit Date - 06/21/2014  Outpatient Primary MD for the patient is No PCP Per Patient   With History of -  Past Medical History  Diagnosis Date  . Bipolar affect, depressed   . Seizures   . Drug abuse   . Suicidal behavior   . Borderline personality disorder     per pt  . PTSD (post-traumatic stress disorder)     per pt  . ADHD (attention deficit hyperactivity disorder)     per pt  . Paranoid schizophrenia     per pt  . Asthma   . Asthma   . Depression   . Schizophrenia   . Bipolar 1 disorder   . Borderline personality disorder       Past Surgical History  Procedure Laterality Date  . Leg surgery      in for   Chief Complaint  Patient presents with  . Blood Clots      HPI  [name] [name]  is a 24 y.o. male, history of bipolar disorder, recreational drug use counseled to quit, schizophrenia, bipolar, ADHD, PTSD, chronic leukocytosis, who presented to [facility] where he was taken from a motel after being found unresponsive. At [facility] he had extensive workup which included an LP with unremarkable CSF, EKG, blood levels which showed chronic leukocytosis, CT angiogram of the chest which showed a possible filling defect in the brachiocephalic vein but a clot could not be ruled out and the recommended upper extremity venous duplex for follow-up, he received full dose Lovenox for 1 dose, his d-dimer apparently was 7 at that time, head CT which was nonacute, a urine drug screen which was positive for amphetamines and cannabinoids, he subsequently signed out AMA and came here.   At [facility] his d-dimer was unremarkable, an upper extremity venous duplex was negative, again  chronic leukocytosis was confirmed, upon further questioning patient claims that he has passed out at least 10-12 times over the last 1 year. He hasn't seek any medical attention. His EKG had nonspecific changes and I was called to admit. He is currently completely symptom free.    Of note his attitude completely dismissive but he is agreeable to staying overnight. He has 2 male friends in the room, one of the male friends is extremely rude for no apparent reason and appears to be in extremely bad mood, she is interrupting questions for no reason. She stormed out of the room went to old not to be due to words to staff.    Review of Systems currently negative   In addition to the HPI above,   No Fever-chills, No Headache, No changes with Vision or hearing, No problems swallowing food or Liquids, No Chest pain, Cough or Shortness of Breath, No Abdominal pain, No Nausea or Vommitting,  Bowel movements are regular, No Blood in stool or Urine, No dysuria, No new skin rashes or bruises, No new joints pains-aches,  No new weakness, tingling, numbness in any extremity, No recent weight gain or loss, No polyuria, polydypsia or polyphagia, No significant Mental Stressors.  A full 10 point Review of Systems was done, except as stated above, all other Review of Systems were negative.   Social History History  Substance Use Topics  . Smoking status: Current Every Day Smoker -- 1.50 packs/day    Types: Cigarettes  . Smokeless tobacco: Not on file  . Alcohol Use: Yes       Family History No history of CAD or sudden cardiac death  Prior to Admission medications   Medication Sig Start Date End Date Taking? Authorizing Provider  HYDROcodone-acetaminophen (NORCO/VICODIN) 5-325 MG per tablet Take 1-2 tablets by mouth every 4 (four) hours as needed for moderate pain or severe pain. 07/16/13  Yes Jillyn LedgerJessica K Palmer, PA-C  ibuprofen (ADVIL,MOTRIN) 800 MG tablet Take 1 tablet (800 mg total) by  mouth 3 (three) times daily. 07/16/13  Yes Jillyn LedgerJessica K Palmer, PA-C  albuterol (PROVENTIL HFA;VENTOLIN HFA) 108 (90 BASE) MCG/ACT inhaler Inhale 1-2 puffs into the lungs every 6 (six) hours as needed for wheezing. Patient not taking: Reported on 06/21/2014 10/17/12   Ivonne AndrewPeter Dammen, PA-C  ARIPiprazole (ABILIFY) 5 MG tablet Take 1 tablet (5 mg total) by mouth daily. For mood control Patient not taking: Reported on 06/21/2014 09/13/12   Sanjuana KavaAgnes I Nwoko, NP  doxycycline (VIBRAMYCIN) 100 MG capsule Take 1 capsule (100 mg total) by mouth 2 (two) times daily. Patient not taking: Reported on 06/21/2014 08/06/12   Zadie Rhineonald Wickline, MD  loratadine (CLARITIN) 10 MG tablet Take 1 tablet (10 mg total) by mouth daily. (May be purchased from the over the counter at yr local pharmacy): For allergies Patient not taking: Reported on 06/21/2014 09/13/12   Sanjuana KavaAgnes I Nwoko, NP  naproxen (NAPROSYN) 500 MG tablet Take 1 tablet (500 mg total) by mouth 2 (two) times daily. Patient not taking: Reported on 06/21/2014 10/17/12   Ivonne AndrewPeter Dammen, PA-C  traZODone (DESYREL) 50 MG tablet Take 1 tablet (50 mg total) by mouth at bedtime as needed for sleep. For sleep Patient not taking: Reported on 06/21/2014 09/13/12   Sanjuana KavaAgnes I Nwoko, NP    Allergies  Allergen Reactions  . Haldol [Haloperidol Lactate] Other (See Comments)    Seizures   . Penicillins Anaphylaxis and Hives  . Penicillins Anaphylaxis and Swelling  . Effexor [Venlafaxine Hydrochloride] Other (See Comments)    "bad reaction"  . Effexor [Venlafaxine Hydrochloride] Other (See Comments)    Feeling "not myself"  . Effexor [Venlafaxine] Other (See Comments)    Side effects are really bad for patient  . Haldol [Haloperidol Decanoate] Other (See Comments)    seizure  . Haldol [Haloperidol Lactate] Other (See Comments)    seizure  . Penicillins Swelling    Physical Exam  Vitals  Blood pressure 131/66, pulse 64, temperature 98.3 F (36.8 C), temperature source Oral, resp. rate 18, SpO2  100 %.   1. General Young thin African-American male lying in bed in NAD,     2. Flat and very dismissive attitude, Not Suicidal or Homicidal, Awake Alert, Oriented X 3.  3. No F.N deficits, ALL C.Nerves Intact, Strength 5/5 all 4 extremities, Sensation intact all 4 extremities, Plantars down going.  4. Ears and Eyes appear Normal, Conjunctivae clear, PERRLA. Moist Oral Mucosa.  5. Supple Neck, No  JVD, No cervical lymphadenopathy appriciated, No Carotid Bruits.  6. Symmetrical Chest wall movement, Good air movement bilaterally, CTAB.  7. RRR, No Gallops, Rubs or Murmurs, No Parasternal Heave.  8. Positive Bowel Sounds, Abdomen Soft, No tenderness, No organomegaly appriciated,No rebound -guarding or rigidity.  9.  No Cyanosis, Normal Skin Turgor, No Skin Rash or Bruise.  10. Good muscle tone,  joints appear normal , no effusions, Normal ROM.  11. No Palpable Lymph Nodes in Neck or Axillae     Data Review  CBC  Recent Labs Lab 06/21/14 1028  WBC 21.7*  HGB 13.2  HCT 40.3  PLT 109*  MCV 92.4  MCH 30.3  MCHC 32.8  RDW 14.1  LYMPHSABS 2.8  MONOABS 1.7*  EOSABS 0.2  BASOSABS 0.0   ------------------------------------------------------------------------------------------------------------------  Chemistries   Recent Labs Lab 06/21/14 1028  NA 139  K 3.9  CL 108  CO2 27  GLUCOSE 85  BUN 9  CREATININE 0.83  CALCIUM 8.7*   ------------------------------------------------------------------------------------------------------------------ CrCl cannot be calculated (Unknown ideal weight.). ------------------------------------------------------------------------------------------------------------------ No results for input(s): TSH, T4TOTAL, T3FREE, THYROIDAB in the last 72 hours.  Invalid input(s): FREET3   Coagulation profile No results for input(s): INR, PROTIME in the last 168  hours. -------------------------------------------------------------------------------------------------------------------  Recent Labs  06/21/14 1028  DDIMER <0.27   -------------------------------------------------------------------------------------------------------------------  Cardiac Enzymes No results for input(s): CKMB, TROPONINI, MYOGLOBIN in the last 168 hours.  Invalid input(s): CK ------------------------------------------------------------------------------------------------------------------ Invalid input(s): POCBNP   ---------------------------------------------------------------------------------------------------------------  Urinalysis    Component Value Date/Time   COLORURINE YELLOW 05/20/2011 0444   APPEARANCEUR CLEAR 05/20/2011 0444   LABSPEC 1.037* 05/20/2011 0444   PHURINE 7.5 05/20/2011 0444   GLUCOSEU NEGATIVE 05/20/2011 0444   HGBUR NEGATIVE 05/20/2011 0444   BILIRUBINUR NEGATIVE 05/20/2011 0444   KETONESUR TRACE* 05/20/2011 0444   PROTEINUR 30* 05/20/2011 0444   UROBILINOGEN 1.0 05/20/2011 0444   NITRITE NEGATIVE 05/20/2011 0444   LEUKOCYTESUR NEGATIVE 05/20/2011 0444    ----------------------------------------------------------------------------------------------------------------  Imaging results:   Dg Chest 2 View  06/21/2014   CLINICAL DATA:  Pain in the arms, some shortness of breath and chest pain, smoking history  EXAM: CHEST  2 VIEW  COMPARISON:  None.  FINDINGS: No active infiltrate or effusion is seen. Mediastinal and hilar contours are unremarkable. The heart is within normal limits in size. No bony abnormality is seen.  IMPRESSION: No active cardiopulmonary disease.   Electronically Signed   By: Dwyane Dee M.D.   On: 06/21/2014 13:21    My personal review of EKG: Rhythm NSR, Rate  68 /min, QTc , nonspecific ST changes    Assessment & Plan    1. Syncope. Multiple episodes, unclear etiology, does do recreational drugs,  EKG is nonacute with some nonspecific ST changes, will keep on 23 are observation on a telemetry bed , cycle troponin check echogram. Lace-on 81 of aspirin for now, chest pain-free. Check orthostatics. Since his girlfriend says that at times he is to space out will check a EEG and have requested neurology to see. CT scan done yesterday at [facility] was unremarkable. No focal deficits.  2. Recreational drug use counseled to quit.   3. Chronic leukocytosis. Present for several years, check peripheral smear to be evaluated by pathologist.   4. Bipolar disorder, schizophrenia, PTSD. Continue home medications. Will hold trazodone to avoid sedation.   5. Recent question of upper extremity clot on CT angiogram chest done at [facility]. D-dimer here unremarkable, follow-up upper extremity venous duplex  was requested at [facility] which was obtained in the ER and unremarkable. Continue prophylactic dose heparin.   6. Nonspecific EKG changes. No chest pain, cycle troponins, one set of troponin at [facility] was negative, 81 of aspirin. Check echogram.      DVT Prophylaxis Heparin   AM Labs Ordered, also please review Full Orders  Family Communication: Admission, patients condition and plan of care including tests being ordered have been discussed with the patient and girlfriend who indicate understanding and agree with the plan and Code Status.  Code Status Full  Likely DC to  Home  Condition Fair  Time spent in minutes : 35    Jaycob Mcclenton K M.D on 06/21/2014 at 1:31 PM  Between 7am to 7pm - Pager - [phone]  After 7pm go to [email or web address] - password [email or web address]  [facility]  Office  [phone]

## 2014-06-22 ENCOUNTER — Observation Stay (HOSPITAL_COMMUNITY): Payer: Self-pay

## 2014-06-22 ENCOUNTER — Encounter (HOSPITAL_COMMUNITY): Payer: Self-pay | Admitting: Cardiology

## 2014-06-22 DIAGNOSIS — F1910 Other psychoactive substance abuse, uncomplicated: Secondary | ICD-10-CM

## 2014-06-22 DIAGNOSIS — R55 Syncope and collapse: Secondary | ICD-10-CM

## 2014-06-22 DIAGNOSIS — D72829 Elevated white blood cell count, unspecified: Secondary | ICD-10-CM

## 2014-06-22 LAB — BASIC METABOLIC PANEL
Anion gap: 4 — ABNORMAL LOW (ref 5–15)
CO2: 28 mmol/L (ref 22–32)
Calcium: 8.8 mg/dL — ABNORMAL LOW (ref 8.9–10.3)
Chloride: 107 mmol/L (ref 101–111)
Creatinine, Ser: 0.86 mg/dL (ref 0.61–1.24)
GFR calc Af Amer: >60 mL/min (ref >60)
GLUCOSE: 69 mg/dL — AB (ref 70–99)
POTASSIUM: 4 mmol/L (ref 3.5–5.1)
Sodium: 139 mmol/L (ref 135–145)

## 2014-06-22 LAB — CBC
HEMATOCRIT: 39.9 % (ref 39.0–52.0)
HEMOGLOBIN: 13.5 g/dL (ref 13.0–17.0)
MCH: 31.3 pg (ref 26.0–34.0)
MCHC: 33.8 g/dL (ref 30.0–36.0)
MCV: 92.6 fL (ref 78.0–100.0)
Platelets: 124 10*3/uL — ABNORMAL LOW (ref 150–400)
RBC: 4.31 MIL/uL (ref 4.22–5.81)
RDW: 14.1 % (ref 11.5–15.5)
WBC: 13.5 10*3/uL — ABNORMAL HIGH (ref 4.0–10.5)

## 2014-06-22 LAB — GLUCOSE, CAPILLARY
GLUCOSE-CAPILLARY: 102 mg/dL — AB (ref 70–99)
GLUCOSE-CAPILLARY: 86 mg/dL (ref 70–99)

## 2014-06-22 MED ORDER — HYDROCODONE-ACETAMINOPHEN 5-325 MG PO TABS
1.0000 | ORAL_TABLET | ORAL | Status: DC | PRN
  Administered 2014-06-23: 1 via ORAL
  Filled 2014-06-22: qty 1

## 2014-06-22 NOTE — Procedures (Signed)
ELECTROENCEPHALOGRAM REPORT   Patient: [name] [name]      Room #: 3E-26 Age: 24 y.o.        Sex: male Referring Physician: Dr Thedore MinsSingh Report Date:  06/22/2014        Interpreting Physician: Omelia BlackwaterSUMNER, Swayze Kozuch JUSTIN  History: [name] [name] is an 24 y.o. male presenting with multiple syncopal events  Medications:  Scheduled: . ARIPiprazole  5 mg Oral Daily  . aspirin  81 mg Oral Daily  . heparin  5,000 Units Subcutaneous 3 times per day  . sodium chloride  3 mL Intravenous Q12H    Conditions of Recording:  This is a 16 channel EEG carried out with the patient in the drowsy state.  Description:  The waking background activity consists of a low voltage, symmetrical, fairly well organized theta activity. With arousal he will have runs of posterior alpha rhythm in the 8-9Hz  range. Low voltage fast activity, poorly organized, is seen anteriorly and is at times superimposed on more posterior regions.  A mixture of theta and alpha rhythms are seen from the central and temporal regions. No focal slowing or epileptiform activity noted.  The patient drowses with slowing to irregular, low voltage theta and beta activity. Normal sleep architecture is not observed.  Hyperventilation was not performed. Intermittent photic stimulation was performed and elicits a symmetrical driving response but fails to elicit any abnormalities.  IMPRESSION: Normal drowsy electroencephalogram. There are no focal lateralizing or epileptiform features.   Elspeth Choeter Chalmers Iddings, DO [facility] [phone]  If 7pm- 7am, please page neurology on call as listed in AMION. 06/22/2014, 11:47 AM

## 2014-06-22 NOTE — Progress Notes (Deleted)
EEG completed, results pending. 

## 2014-06-22 NOTE — Progress Notes (Signed)
EEG completed, results pending. 

## 2014-06-22 NOTE — Consult Note (Signed)
Reason for Consult: syncope in 24 year old   Referring Physician: Dr. Algis Liming   PCP:  No PCP Per Patient  Primary Cardiologist: new  [name] [name] is an 24 y.o. male.    Chief Complaint: presented to [facility] with blood clots, previously found unresponsive.  Multiple episodes of syncope.   HPI: 24 y.o. male, history of bipolar disorder, recreational drug use counseled to quit, schizophrenia, bipolar, ADHD, PTSD, chronic leukocytosis, who presented to [facility] where he was taken from a motel after being found unresponsive. At [facility] ([place]) he had extensive workup which included an LP with unremarkable CSF, EKG, blood levels which showed chronic leukocytosis, CT angiogram of the chest which showed a possible filling defect in the brachiocephalic vein but a clot could not be ruled out and the recommended upper extremity venous duplex for follow-up, he received full dose Lovenox for 1 dose, his d-dimer apparently was 7 at that time, head CT which was nonacute, a urine drug screen which was positive for amphetamines and cannabinoids, he subsequently signed out AMA and came here.   At [facility] his d-dimer was unremarkable, an upper extremity venous duplex was negative, again chronic leukocytosis was confirmed, upon further questioning patient claims that he has passed out at least 10-12 times over the last 1 year. He did not seek any medical attention. He has no awareness of episodes and he does not loose bowel or bladder control.  He is out 25 min to 1 hour.  No palpitations.   EKG RSR' in V1-2 and ST with early repol in V3-V4 both date back to 2014.   Troponins negative, EEG no focal lateralizing or epileptiform features.  Denies chest pain and SOB  ECHO: Left ventricle: The cavity size was normal. Systolic function was normal. The estimated ejection fraction was in the range of 55% to 60%. Wall motion was normal; there were no regional  wall motion abnormalities. - Left atrium: The atrium was mildly dilated. - Right ventricle: The cavity size was mildly dilated. Wall thickness was normal. - Right atrium: The atrium was mildly dilated. - Tricuspid valve: There was trivial regurgitation.  VENOUS Doppler without upper ext DVT.  Past Medical History  Diagnosis Date  . Bipolar affect, depressed   . Seizures   . Drug abuse   . Suicidal behavior   . Borderline personality disorder     per pt  . PTSD (post-traumatic stress disorder)     per pt  . ADHD (attention deficit hyperactivity disorder)     per pt  . Paranoid schizophrenia     per pt  . Asthma   . Asthma   . Depression   . Schizophrenia   . Bipolar 1 disorder   . Borderline personality disorder   . Syncope 06/2014    Past Surgical History  Procedure Laterality Date  . Leg surgery      History reviewed. No pertinent family history. father died by GSW, Mother without CAD    Social History:  reports that he has been smoking Cigarettes.  He has been smoking about 1.50 packs per day. He does not have any smokeless tobacco history on file. He reports that he drinks alcohol. He reports that he uses illicit drugs (Marijuana) about 4 times per week.  Allergies:  Allergies  Allergen Reactions  . Haldol [Haloperidol Lactate] Other (See Comments)    Seizures   . Penicillins Anaphylaxis and Hives  .  Penicillins Anaphylaxis and Swelling  . Effexor [Venlafaxine Hydrochloride] Other (See Comments)    "bad reaction"  . Effexor [Venlafaxine Hydrochloride] Other (See Comments)    Feeling "not myself"  . Effexor [Venlafaxine] Other (See Comments)    Side effects are really bad for patient  . Haldol [Haloperidol Decanoate] Other (See Comments)    seizure  . Haldol [Haloperidol Lactate] Other (See Comments)    seizure  . Penicillins Swelling    OUTPATIENT MEDICATIONS: No current facility-administered medications on file prior to encounter.   Current  Outpatient Prescriptions on File Prior to Encounter  Medication Sig Dispense Refill  . HYDROcodone-acetaminophen (NORCO/VICODIN) 5-325 MG per tablet Take 1-2 tablets by mouth every 4 (four) hours as needed for moderate pain or severe pain. 6 tablet 0  . ibuprofen (ADVIL,MOTRIN) 800 MG tablet Take 1 tablet (800 mg total) by mouth 3 (three) times daily. 21 tablet 0  . albuterol (PROVENTIL HFA;VENTOLIN HFA) 108 (90 BASE) MCG/ACT inhaler Inhale 1-2 puffs into the lungs every 6 (six) hours as needed for wheezing. (Patient not taking: Reported on 06/21/2014) 1 Inhaler 0  . ARIPiprazole (ABILIFY) 5 MG tablet Take 1 tablet (5 mg total) by mouth daily. For mood control (Patient not taking: Reported on 06/21/2014) 30 tablet 0  . doxycycline (VIBRAMYCIN) 100 MG capsule Take 1 capsule (100 mg total) by mouth 2 (two) times daily. (Patient not taking: Reported on 06/21/2014) 20 capsule 0  . loratadine (CLARITIN) 10 MG tablet Take 1 tablet (10 mg total) by mouth daily. (May be purchased from the over the counter at yr local pharmacy): For allergies (Patient not taking: Reported on 06/21/2014)    . naproxen (NAPROSYN) 500 MG tablet Take 1 tablet (500 mg total) by mouth 2 (two) times daily. (Patient not taking: Reported on 06/21/2014) 30 tablet 0  . traZODone (DESYREL) 50 MG tablet Take 1 tablet (50 mg total) by mouth at bedtime as needed for sleep. For sleep (Patient not taking: Reported on 06/21/2014) 30 tablet 0    alum & mag hydroxide-simeth, guaiFENesin-dextromethorphan, HYDROcodone-acetaminophen, ondansetron **OR** ondansetron (ZOFRAN) IV, polyethylene glycol     Results for orders placed or performed during the hospital encounter of 06/21/14 (from the past 48 hour(s))  CBC with Differential/Platelet     Status: Abnormal   Collection Time: 06/21/14 10:28 AM  Result Value Ref Range   WBC 21.7 (H) 4.0 - 10.5 K/uL   RBC 4.36 4.22 - 5.81 MIL/uL   Hemoglobin 13.2 13.0 - 17.0 g/dL   HCT 40.3 39.0 - 52.0 %   MCV 92.4  78.0 - 100.0 fL   MCH 30.3 26.0 - 34.0 pg   MCHC 32.8 30.0 - 36.0 g/dL   RDW 14.1 11.5 - 15.5 %   Platelets 109 (L) 150 - 400 K/uL    Comment: SPECIMEN CHECKED FOR CLOTS REPEATED TO VERIFY PLATELET COUNT CONFIRMED BY SMEAR    Neutrophils Relative % 78 (H) 43 - 77 %   Lymphocytes Relative 13 12 - 46 %   Monocytes Relative 8 3 - 12 %   Eosinophils Relative 1 0 - 5 %   Basophils Relative 0 0 - 1 %   Neutro Abs 17.0 (H) 1.7 - 7.7 K/uL   Lymphs Abs 2.8 0.7 - 4.0 K/uL   Monocytes Absolute 1.7 (H) 0.1 - 1.0 K/uL   Eosinophils Absolute 0.2 0.0 - 0.7 K/uL   Basophils Absolute 0.0 0.0 - 0.1 K/uL   Smear Review MORPHOLOGY UNREMARKABLE   D-dimer, quantitative  Status: None   Collection Time: 06/21/14 10:28 AM  Result Value Ref Range   D-Dimer, Quant <0.27 0.00 - 0.48 ug/mL-FEU    Comment:        AT THE INHOUSE ESTABLISHED CUTOFF VALUE OF 0.48 ug/mL FEU, THIS ASSAY HAS BEEN DOCUMENTED IN THE LITERATURE TO HAVE A SENSITIVITY AND NEGATIVE PREDICTIVE VALUE OF AT LEAST 98 TO 99%.  THE TEST RESULT SHOULD BE CORRELATED WITH AN ASSESSMENT OF THE CLINICAL PROBABILITY OF DVT / VTE.   Basic metabolic panel     Status: Abnormal   Collection Time: 06/21/14 10:28 AM  Result Value Ref Range   Sodium 139 135 - 145 mmol/L   Potassium 3.9 3.5 - 5.1 mmol/L   Chloride 108 101 - 111 mmol/L   CO2 27 22 - 32 mmol/L   Glucose, Bld 85 70 - 99 mg/dL   BUN 9 6 - 20 mg/dL   Creatinine, Ser 0.83 0.61 - 1.24 mg/dL   Calcium 8.7 (L) 8.9 - 10.3 mg/dL   GFR calc non Af Amer >60 >60 mL/min   GFR calc Af Amer >60 >60 mL/min    Comment: (NOTE) The eGFR has been calculated using the CKD EPI equation. This calculation has not been validated in all clinical situations. eGFR's persistently <90 mL/min signify possible Chronic Kidney Disease.    Anion gap 4 (L) 5 - 15  Troponin I (q 6hr x 3)     Status: None   Collection Time: 06/21/14  2:17 PM  Result Value Ref Range   Troponin I <0.03 <0.031 ng/mL     Comment:        NO INDICATION OF MYOCARDIAL INJURY.   CBC     Status: Abnormal   Collection Time: 06/21/14  5:12 PM  Result Value Ref Range   WBC 15.2 (H) 4.0 - 10.5 K/uL   RBC 4.22 4.22 - 5.81 MIL/uL   Hemoglobin 13.0 13.0 - 17.0 g/dL   HCT 38.5 (L) 39.0 - 52.0 %   MCV 91.2 78.0 - 100.0 fL   MCH 30.8 26.0 - 34.0 pg   MCHC 33.8 30.0 - 36.0 g/dL   RDW 14.1 11.5 - 15.5 %   Platelets 134 (L) 150 - 400 K/uL  Creatinine, serum     Status: None   Collection Time: 06/21/14  5:12 PM  Result Value Ref Range   Creatinine, Ser 0.94 0.61 - 1.24 mg/dL   GFR calc non Af Amer >60 >60 mL/min   GFR calc Af Amer >60 >60 mL/min    Comment: (NOTE) The eGFR has been calculated using the CKD EPI equation. This calculation has not been validated in all clinical situations. eGFR's persistently <90 mL/min signify possible Chronic Kidney Disease.   Basic metabolic panel     Status: Abnormal   Collection Time: 06/22/14  3:04 AM  Result Value Ref Range   Sodium 139 135 - 145 mmol/L   Potassium 4.0 3.5 - 5.1 mmol/L   Chloride 107 101 - 111 mmol/L   CO2 28 22 - 32 mmol/L   Glucose, Bld 69 (L) 70 - 99 mg/dL   BUN <5 (L) 6 - 20 mg/dL   Creatinine, Ser 0.86 0.61 - 1.24 mg/dL   Calcium 8.8 (L) 8.9 - 10.3 mg/dL   GFR calc non Af Amer >60 >60 mL/min   GFR calc Af Amer >60 >60 mL/min    Comment: (NOTE) The eGFR has been calculated using the CKD EPI equation. This calculation has not been  validated in all clinical situations. eGFR's persistently <90 mL/min signify possible Chronic Kidney Disease.    Anion gap 4 (L) 5 - 15  CBC     Status: Abnormal   Collection Time: 06/22/14  3:04 AM  Result Value Ref Range   WBC 13.5 (H) 4.0 - 10.5 K/uL   RBC 4.31 4.22 - 5.81 MIL/uL   Hemoglobin 13.5 13.0 - 17.0 g/dL   HCT 39.9 39.0 - 52.0 %   MCV 92.6 78.0 - 100.0 fL   MCH 31.3 26.0 - 34.0 pg   MCHC 33.8 30.0 - 36.0 g/dL   RDW 14.1 11.5 - 15.5 %   Platelets 124 (L) 150 - 400 K/uL  Glucose, capillary      Status: Abnormal   Collection Time: 06/22/14 10:27 AM  Result Value Ref Range   Glucose-Capillary 102 (H) 70 - 99 mg/dL  Glucose, capillary     Status: None   Collection Time: 06/22/14 12:03 PM  Result Value Ref Range   Glucose-Capillary 86 70 - 99 mg/dL   Dg Chest 2 View  06/21/2014   CLINICAL DATA:  Pain in the arms, some shortness of breath and chest pain, smoking history  EXAM: CHEST  2 VIEW  COMPARISON:  None.  FINDINGS: No active infiltrate or effusion is seen. Mediastinal and hilar contours are unremarkable. The heart is within normal limits in size. No bony abnormality is seen.  IMPRESSION: No active cardiopulmonary disease.   Electronically Signed   By: Ivar Drape M.D.   On: 06/21/2014 13:21    ROS: General:no colds or fevers, + weight loss since 2014 Skin:no rashes or ulcers HEENT:no blurred vision, no congestion CV:see HPI PUL:see HPI GI:no diarrhea constipation or melena, no indigestion GU:no hematuria, no dysuria MS:no joint pain, no claudication Neuro:+ syncope, no lightheadedness Endo:no diabetes, no thyroid disease   Blood pressure 125/65, pulse 70, temperature 98.7 F (37.1 C), temperature source Oral, resp. rate 18, height 6' (1.829 m), weight 159 lb 9.8 oz (72.4 kg), SpO2 99 %.  Wt Readings from Last 3 Encounters:  06/22/14 159 lb 9.8 oz (72.4 kg)  09/09/12 160 lb (72.576 kg)  08/05/12 177 lb (80.287 kg)    PE: General:Pleasant affect, NAD Skin:Warm and dry, brisk capillary refill HEENT:normocephalic, sclera clear, mucus membranes moist Neck:supple, no JVD, no bruits  Heart:S1S2 RRR without murmur, gallup, rub or click Lungs:clear without rales, rhonchi, or wheezes CBU:LAGT, non tender, + BS, do not palpate liver spleen or masses, very thin Ext:no lower ext edema, 2+ pedal pulses, 2+ radial pulses Neuro:alert and oriented X 3, MAE, follows commands, + facial symmetry Tele:  SR to SB. To 48    Assessment/Plan Principal Problem:   Syncope- doubt cardiac,  EKG stable for 2 years. Echo stable  EF 55-60% and no chest pain.  Recommend no street drug use.   Active Problems:   Polysubstance abuse   Substance induced mood disorder   Leukocytosis   Guadlupe Spanish R  Nurse Practitioner Certified [place] Pager [phone] or after 5pm or weekends call [phone] 06/22/2014, 4:44 PM

## 2014-06-22 NOTE — Progress Notes (Signed)
PROGRESS NOTE    [name] [name] WJX:[id number] DOB: 10-17-1990 DOA: 06/21/2014 PCP: No PCP Per Patient  HPI/Brief narrative 24 year old male, [place] resident, history of extensive psychiatric illness (bipolar disorder/PTSD/borderline personality disorder/ADHD and schizophrenia listed under PMH) [facility], recreational drug abuse and chronic leukocytosis, recently [facility] for unresponsiveness. He apparently underwent extensive workup which was unremarkable: LP, EKG, CBC (showed chronic leukocytosis), head CT. UDS did show amphetamines and cannabinoids. D-dimer elevated at 7. CTA chest apparently showed a possible filling defect in the brachiocephalic vein and clot could not be ruled out. Upper extremity venous Doppler was recommended but patient signed out AMA and [facility]. Here d-dimer was negative. Upper extremity venous Doppler was negative. He gave history of passing out at least 10-12 times over the last 1 year for which she apparently has not sought medical attention. He reported biting his lip during one event. These episodes have largely been unwitnessed but his girlfriend apparently has noted episodes where he will stare off blankly but no abnormal movements noted. [facility] neurolog[facility].   Assessment/Plan:  Recurrent syncope - Unclear etiology.  - Patient is a very poor historian. Friend at bedside is deep asleep and not of assistance. A proper history would definitely be helpful. - EKG without acute changes. EKG shows sinus rhythm, RSR in V1-2, elevated J-point in chest leads and QTC of 414 ms - Troponin 1 negative - Telemetry: Sinus rhythm without arrhythmias - Orthostatic blood pressure checks: Negative - 2-D echo: LVEF 55-60 percent - Recent [facility] unremarkable: CT head and LP (for some reason did not seek care everywhere tab on patient's chart and this detail was obtained from  [facility]) - EEG: Normal drowsy EEG and no focal lateralizing or epileptiform features. - Discussed with neurology who do not recommend any further neuro workup but agree with cardiology input. - Cardiology consulted - Patient advised that he should not drive, operate heavy machinery, perform activities at heights or participate in water activities until released by outpatient neurology. He verbalized understanding and states that he does not drive.  Polysubstance abuse - Cessation counseled.  Chronic leukocytosis - Stable. Unclear etiology.  Extensive psychiatric history (bipolar disorder/PTSD/borderline personality disorder/ADHD and schizophrenia listed under PMH) - Continue home medications. - Follow[facility].  Recent question of upper extremity clot (which extremity?) on CTA [facility] - D-dimer here negative - Bilateral upper extremity venous Dopplers negative  Thrombocytopenia - Seems chronic and intermittent. Stable unclear etiology  Code Status: Full Family Communication: Patient's male friend sleeping and unable to discuss at this time. Disposition Plan: DC home pending cardiology input   Consultants:  Neurology  Cardiology  Procedures:  EEG  Antibiotics:  None   Subjective: Denies complaints. States that he has had at least 4 episodes of passing out this year.? Both while lying down and in upright position. Denies preceding warning signs i.e. dizziness, lightheadedness, chest pain, palpitations, flushed feeling.   Objective: Filed Vitals:   06/21/14 1556 06/21/14 2016 06/22/14 0535 06/22/14 1025  BP: 132/59 115/60 118/64 101/59  Pulse: 74 64 61 62  Temp: 98.6 F (37 C) 98.4 F (36.9 C) 97.5 F (36.4 C)   TempSrc: Oral Oral Oral   Resp: 16 18 18    Height: 6' (1.829 m)     Weight: 72.258 kg (159 lb 4.8 oz)  72.4 kg (159 lb 9.8  oz)   SpO2: 94% 98% 98%     Intake/Output Summary (Last 24 hours) at 06/22/14  1501 Last data filed at 06/22/14 1240  Gross per 24 hour  Intake   1526 ml  Output   1825 ml  Net   -299 ml   Filed Weights   06/21/14 1556 06/22/14 0535  Weight: 72.258 kg (159 lb 4.8 oz) 72.4 kg (159 lb 9.8 oz)     Exam:  General exam: Well built and nourished young male lying comfortably supine in bed Respiratory system: Clear. No increased work of breathing. Cardiovascular system: S1 & S2 heard, RRR. No JVD, murmurs, gallops, clicks or pedal edema. Telemetry: Sinus rhythm Gastrointestinal system: Abdomen is nondistended, soft and nontender. Normal bowel sounds heard. Central nervous system: Somnolent this morning but easily arousable and oriented. No focal neurological deficits. Extremities: Symmetric 5 x 5 power. Psychiatry: Difficult to assess at this time   Data Reviewed: Basic Metabolic Panel:  Recent Labs Lab 06/21/14 1028 06/21/14 1712 06/22/14 0304  NA 139  --  139  K 3.9  --  4.0  CL 108  --  107  CO2 27  --  28  GLUCOSE 85  --  69*  BUN 9  --  <5*  CREATININE 0.83 0.94 0.86  CALCIUM 8.7*  --  8.8*   Liver Function Tests: No results for input(s): AST, ALT, ALKPHOS, BILITOT, PROT, ALBUMIN in the last 168 hours. No results for input(s): LIPASE, AMYLASE in the last 168 hours. No results for input(s): AMMONIA in the last 168 hours. CBC:  Recent Labs Lab 06/21/14 1028 06/21/14 1712 06/22/14 0304  WBC 21.7* 15.2* 13.5*  NEUTROABS 17.0*  --   --   HGB 13.2 13.0 13.5  HCT 40.3 38.5* 39.9  MCV 92.4 91.2 92.6  PLT 109* 134* 124*   Cardiac Enzymes:  Recent Labs Lab 06/21/14 1417  TROPONINI <0.03   BNP (last 3 results) No results for input(s): PROBNP in the last 8760 hours. CBG:  Recent Labs Lab 06/22/14 1027 06/22/14 1203  GLUCAP 102* 86    No results found for this or any previous visit (from the past 240 hour(s)).      Studies: Dg Chest 2 View  06/21/2014   CLINICAL DATA:  Pain in the arms, some shortness of breath and chest pain,  smoking history  EXAM: CHEST  2 VIEW  COMPARISON:  None.  FINDINGS: No active infiltrate or effusion is seen. Mediastinal and hilar contours are unremarkable. The heart is within normal limits in size. No bony abnormality is seen.  IMPRESSION: No active cardiopulmonary disease.   Electronically Signed   By: Dwyane DeePaul  Barry M.D.   On: 06/21/2014 13:21        Scheduled Meds: . ARIPiprazole  5 mg Oral Daily  . aspirin  81 mg Oral Daily  . heparin  5,000 Units Subcutaneous 3 times per day  . sodium chloride  3 mL Intravenous Q12H   Continuous Infusions:   Principal Problem:   Syncope Active Problems:   Polysubstance abuse   Substance induced mood disorder   Leukocytosis    Time spent: 40 minutes    Vernel Langenderfer, MD, FACP, FHM. [facility] Pager [phone]  If 7PM-7AM, please contact night-coverage [email or web address] Password [email or web address] 06/22/2014, 3:01 PM

## 2014-06-22 NOTE — Progress Notes (Signed)
Subjective: No overnight events. No further episodes   EEG is unremarkable.   Objective: Current vital signs: BP 118/64 mmHg  Pulse 61  Temp(Src) 97.5 F (36.4 C) (Oral)  Resp 18  Ht 6' (1.829 m)  Wt 72.4 kg (159 lb 9.8 oz)  BMI 21.64 kg/m2  SpO2 98% Vital signs in last 24 hours: Temp:  [97.5 F (36.4 C)-98.8 F (37.1 C)] 97.5 F (36.4 C) (05/05 0535) Pulse Rate:  [61-78] 61 (05/05 0535) Resp:  [16-18] 18 (05/05 0535) BP: (103-132)/(38-66) 118/64 mmHg (05/05 0535) SpO2:  [94 %-100 %] 98 % (05/05 0535) Weight:  [72.258 kg (159 lb 4.8 oz)-72.4 kg (159 lb 9.8 oz)] 72.4 kg (159 lb 9.8 oz) (05/05 0535)  Intake/Output from previous day: 05/04 0701 - 05/05 0700 In: 933 [P.O.:933] Out: 1100 [Urine:1100] Intake/Output this shift: Total I/O In: 233 [P.O.:233] Out: 300 [Urine:300] Nutritional status: Diet regular Room service appropriate?: Yes; Fluid consistency:: Thin  Neurologic Exam: Mental Status: Alert, oriented, thought content appropriate. Speech fluent without evidence of aphasia.  Cranial Nerves: II:  pupils equal, round, reactive to light  III,IV, VI:  extra-ocular motions intact bilaterally V,VII: smile symmetric, facial light touch sensation normal bilaterally Motor: Moves all extremities symmetrically and against resistance Sensory: light touch intact throughout, bilaterally Lab Results: Basic Metabolic Panel:  Recent Labs Lab 06/21/14 1028 06/21/14 1712 06/22/14 0304  NA 139  --  139  K 3.9  --  4.0  CL 108  --  107  CO2 27  --  28  GLUCOSE 85  --  69*  BUN 9  --  <5*  CREATININE 0.83 0.94 0.86  CALCIUM 8.7*  --  8.8*    Liver Function Tests: No results for input(s): AST, ALT, ALKPHOS, BILITOT, PROT, ALBUMIN in the last 168 hours. No results for input(s): LIPASE, AMYLASE in the last 168 hours. No results for input(s): AMMONIA in the last 168 hours.  CBC:  Recent Labs Lab 06/21/14 1028 06/21/14 1712 06/22/14 0304  WBC 21.7* 15.2* 13.5*   NEUTROABS 17.0*  --   --   HGB 13.2 13.0 13.5  HCT 40.3 38.5* 39.9  MCV 92.4 91.2 92.6  PLT 109* 134* 124*    Cardiac Enzymes:  Recent Labs Lab 06/21/14 1417  TROPONINI <0.03    Lipid Panel: No results for input(s): CHOL, TRIG, HDL, CHOLHDL, VLDL, LDLCALC in the last 168 hours.  CBG: No results for input(s): GLUCAP in the last 168 hours.  Microbiology: No results found for this or any previous visit.  Coagulation Studies: No results for input(s): LABPROT, INR in the last 72 hours.  Imaging: Dg Chest 2 View  06/21/2014   CLINICAL DATA:  Pain in the arms, some shortness of breath and chest pain, smoking history  EXAM: CHEST  2 VIEW  COMPARISON:  None.  FINDINGS: No active infiltrate or effusion is seen. Mediastinal and hilar contours are unremarkable. The heart is within normal limits in size. No bony abnormality is seen.  IMPRESSION: No active cardiopulmonary disease.   Electronically Signed   By: Dwyane DeePaul  Barry M.D.   On: 06/21/2014 13:21    Medications:  Scheduled: . ARIPiprazole  5 mg Oral Daily  . aspirin  81 mg Oral Daily  . heparin  5,000 Units Subcutaneous 3 times per day  . sodium chloride  3 mL Intravenous Q12H    Assessment/Plan: 23y/o gentleman with history of bipolar, schizophrenia, substance abuse presenting with recurrent episodes of LOC. Unclear etiology. Events do not  appear typical for seizure. EEG is unremarkable. He may benefit from cardiology follow up for possible Holter or loop recorder. If symptoms persist may benefit from outpatient ambulatory EEG. Patient is unable to drive, operate heavy machinery, perform activities at heights or participate in water activities until release by outpatient neurology.       [name] Choeter Duvid Smalls, DO [facility] [phone]  If 7pm- 7am, please page neurology on call as listed in AMION. 06/22/2014  9:30 AM

## 2014-06-23 NOTE — Discharge Summary (Signed)
Physician Discharge Summary  [name] BaleJames T [name] ZOX:[id number] DOB: Jan 23, 1991 DOA: 06/21/2014  PCP: No PCP Per Patient  Admit date: 06/21/2014 Discharge date: 06/23/2014  Time spent: Less than 30 minutes  Patient left the Hospital AGAINST MEDICAL ADVICE  Recommendations for Outpatient Follow-up:  Patient left AMA without appropriate discharge planning or process.  Discharge Diagnoses:  Principal Problem:   Syncope Active Problems:   Polysubstance abuse   Substance induced mood disorder   Leukocytosis   Faintness   Discharge Condition: Guarded and at risk for decline.    Filed Weights   06/21/14 1556 06/22/14 0535 06/23/14 0627  Weight: 72.258 kg (159 lb 4.8 oz) 72.4 kg (159 lb 9.8 oz) 71.8 kg (158 lb 4.6 oz)    History of present illness:  24 year old male,  resident, history of extensive psychiatric illness (bipolar disorder/PTSD/borderline personality disorder/ADHD and schizophrenia listed under PMH) [facility], recreational drug abuse and chronic leukocytosis, recently [facility] for unresponsiveness. He apparently underwent extensive workup which was unremarkable: LP, EKG, CBC (showed chronic leukocytosis), head CT. UDS did show amphetamines and cannabinoids. D-dimer elevated at 7. CTA chest apparently showed a possible filling defect in the brachiocephalic vein and clot could not be ruled out. Upper extremity venous Doppler was recommended but patient signed out AMA and presented to [facility] ED on 5/4. Here d-dimer was negative. Upper extremity venous Doppler was negative. He gave history of passing out at least 10-12 times over the last 1 year for which she apparently has not sought medical attention. He reported biting his lip during one event. These episodes have largely been unwitnessed but his girlfriend apparently has noted episodes where he will stare off blankly but no abnormal movements noted. He was [facility] to [facility] for neurology  consultation.  Hospital Course:   Recurrent syncope - Unclear etiology.  - EKG without acute changes. EKG shows sinus rhythm, RSR in V1-2, elevated J-point in chest leads and QTC of 414 ms - Troponin 1 negative - Telemetry: Sinus rhythm without arrhythmias - Orthostatic blood pressure checks: Negative - 2-D echo: LVEF 55-60 percent - Recent [facility] at [facility] unremark[facility]: CT head and LP (for some reason did not see care everywhere tab on patient's chart and this detail was obtained from [facility]) - EEG: Normal drowsy EEG and no focal lateralizing or epileptiform features. - Patient had slightly low blood sugar on BMP yesterday but CBG checks subsequently were normal. - Neurology input appreciated: Etiology of syncopal episodes unclear. Events do not appear typical for seizure. EEG is unremarkable. Recommended cardiology follow-up for possible Holter or loop recorder. - Cardiology consultation appreciated: Patient and his male friend did not cooperate with cardiologist for adequate history. EKG unchanged from prior tracings and reveals incomplete right bundle-branch with early repolarization. As per cardiology, his psychiatric/behavioral/substance abuse, tribute to these episodes. Recommended continue to monitor the patient to look for any evidence of arrhythmia. Did not recommend any specific evaluation at this time. - Patient and his male friend at bedside advised today again that he should not drive, operate heavy machinery, perform activities at heights or participate in water activities until released by outpatient neurology. He verbalized understanding and states that he does not drive. - This morning, patient's RN informed that patient wanted to sign out AMA. Went into the room and discussed with patient and his male friend at bedside. Patient denied complaints and stated that he only came to the hospital because he was concerned about clots in  his arms and since workup for that was  negative, he did not see a reason to stay in the hospital. Attempted to reason with him that evaluation for his recurrent syncope was ongoing and would like to wait for cardiology input today regarding further investigations i.e. heart monitor. If cardiologist did not make any further recommendations, we would discharge him as soon as possible. Patient declined to stay. He was warned regarding risks of decline and even death. He verbalized understanding and left AMA. He has capacity to make medical decisions but believe he is making poor judgment. His male friend had no further questions.  Polysubstance abuse - Cessation counseled.  Chronic leukocytosis - Stable. Unclear etiology.  Extensive psychiatric history (bipolar disorder/PTSD/borderline personality disorder/ADHD and schizophrenia listed under PMH) - Continue home medications. - Follows with outpatient psychiatry at [facility].  Recent question of upper extremity clot (which extremity?) on CTA chest done at [facility] - D-dimer here negative - Bilateral upper extremity venous Dopplers negative  Thrombocytopenia - Seems chronic and intermittent. Stable unclear etiology    Consultants:  Neurology  Cardiology  Procedures:  EEG   Discharge Exam:  Complaints: Patient denied complaints and insisted on going home. As per nursing, no acute events.  Filed Vitals:   06/22/14 1514 06/22/14 2013 06/23/14 0627 06/23/14 0932  BP: 125/65 117/52 95/62 129/66  Pulse: 70 60 69 97  Temp: 98.7 F (37.1 C) 98.1 F (36.7 C) 97.2 F (36.2 C)   TempSrc: Oral Oral Oral   Resp:  Height:      Weight:   71.8 kg (158 lb 4.6 oz)   SpO2: 99% 98% 98% 98%    General exam: Well built and nourished young male sitting up comfortably at edge of bed this morning. Respiratory system: Clear. No increased work of breathing. Cardiovascular system: S1 & S2 heard, RRR. No JVD, murmurs, gallops, clicks or pedal edema.  Gastrointestinal system:  Abdomen is nondistended, soft and nontender. Normal bowel sounds heard. Central nervous system: Alert and oriented. No focal neurological deficits. Extremities: Symmetric 5 x 5 power. Psychiatry: Pleasant and appropriate.  Discharge Instructions   patient left AMA without waiting for appropriate discharge process.   The results of significant diagnostics from this hospitalization (including imaging, microbiology, ancillary and laboratory) are listed below for reference.    Significant Diagnostic Studies: Dg Chest 2 View  06/21/2014   CLINICAL DATA:  Pain in the arms, some shortness of breath and chest pain, smoking history  EXAM: CHEST  2 VIEW  COMPARISON:  None.  FINDINGS: No active infiltrate or effusion is seen. Mediastinal and hilar contours are unremarkable. The heart is within normal limits in size. No bony abnormality is seen.  IMPRESSION: No active cardiopulmonary disease.   Electronically Signed   By: Dwyane Dee M.D.   On: 06/21/2014 13:21    Microbiology: No results found for this or any previous visit (from the past 240 hour(s)).   Labs: Basic Metabolic Panel:  Recent Labs Lab 06/21/14 1028 06/21/14 1712 06/22/14 0304  NA 139  --  139  K 3.9  --  4.0  CL 108  --  107  CO2 27  --  28  GLUCOSE 85  --  69*  BUN 9  --  <5*  CREATININE 0.83 0.94 0.86  CALCIUM 8.7*  --  8.8*   Liver Function Tests: No results for input(s): AST, ALT, ALKPHOS, BILITOT, PROT, ALBUMIN in the last 168 hours. No results for input(s):  LIPASE, AMYLASE in the last 168 hours. No results for input(s): AMMONIA in the last 168 hours. CBC:  Recent Labs Lab 06/21/14 1028 06/21/14 1712 06/22/14 0304  WBC 21.7* 15.2* 13.5*  NEUTROABS 17.0*  --   --   HGB 13.2 13.0 13.5  HCT 40.3 38.5* 39.9  MCV 92.4 91.2 92.6  PLT 109* 134* 124*   Cardiac Enzymes:  Recent Labs Lab 06/21/14 1417  TROPONINI <0.03   BNP: BNP (last 3 results) No results for input(s): BNP in the last 8760  hours.  ProBNP (last 3 results) No results for input(s): PROBNP in the last 8760 hours.  CBG:  Recent Labs Lab 06/22/14 1027 06/22/14 1203  GLUCAP 102* 86       Signed:  Ruthella Kirchman, MD, FACP, FHM. [facility] Pager [phone]  If 7PM-7AM, please contact night-coverage [email or web address] Password [email or web address] 06/23/2014, 5:14 PM

## 2014-06-23 NOTE — Progress Notes (Signed)
Pt has decided to leave AMA. Risks to leaving AMA explained to pt in full detail including possibility for sudden death. Pt is AOx4 and verbalized understanding but still wishes to sign AMA. AMA papers signed. Dr. Waymon AmatoHongalgi at bedside and explained risks and benefits to pt. Pt still wishes to leave AMA.  IV removed. Telemetry removed. Pt walked off floor.  Instructed pt he is not to drive or operate heavy machinery due to his syncopal episodes. Pt verbalized understanding.   Jilda PandaBethany Nikolus Marczak RN

## 2014-06-23 NOTE — ED Provider Notes (Signed)
CSN: [id number]     Arrival date & time 06/21/14  0757 History   First MD Initiated Contact with Patient 06/21/14 [phone]     Chief Complaint  Patient presents with  . Blood Clots      (Consider location/radiation/quality/duration/timing/severity/associated sxs/prior Treatment) The history is provided by the patient.   24 year old male here for blood clots in the left shoulder area. Patient did state at triage that the thinks that he passed out prior to [facility]. Patient was evaluated there and they found.concerns for left shoulder area blood clots patient was given Lovenox he left there AMA. Patient not very clear about his symptom complex. Will [facility] the ED [facility]. Patient also states that he had a lumbar puncture done as well. On exactly clear why. Patient denies any headache or fevers any neck or back pain to me. Other some discomfort where they did a lumbar puncture.  Past Medical History  Diagnosis Date  . Bipolar affect, depressed   . Seizures   . Drug abuse   . Suicidal behavior   . Borderline personality disorder     per pt  . PTSD (post-traumatic stress disorder)     per pt  . ADHD (attention deficit hyperactivity disorder)     per pt  . Paranoid schizophrenia     per pt  . Asthma   . Asthma   . Depression   . Schizophrenia   . Bipolar 1 disorder   . Borderline personality disorder   . Syncope 06/2014   Past Surgical History  Procedure Laterality Date  . Leg surgery     History reviewed. No pertinent family history. History  Substance Use Topics  . Smoking status: Current Every Day Smoker -- 1.50 packs/day    Types: Cigarettes  . Smokeless tobacco: Not on file  . Alcohol Use: Yes    Review of Systems  Constitutional: Positive for fatigue. Negative for fever.  HENT: Negative for congestion.   Eyes: Negative for visual disturbance.  Respiratory: Negative for shortness of breath.   Cardiovascular: Negative  for chest pain and leg swelling.  Gastrointestinal: Negative for nausea, vomiting and abdominal pain.  Genitourinary: Negative for hematuria.  Musculoskeletal: Positive for back pain. Negative for neck pain and neck stiffness.  Skin: Negative for rash.  Neurological: Negative for headaches.  Hematological: Does not bruise/bleed easily.  Psychiatric/Behavioral: Negative for confusion.      Allergies  Haldol; Penicillins; Penicillins; Effexor; Effexor; Effexor; Haldol; Haldol; and Penicillins  Home Medications   Prior to Admission medications   Medication Sig Start Date End Date Taking? Authorizing Provider  HYDROcodone-acetaminophen (NORCO/VICODIN) 5-325 MG per tablet Take 1-2 tablets by mouth every 4 (four) hours as needed for moderate pain or severe pain. 07/16/13  Yes Jillyn LedgerJessica K Palmer, PA-C  ibuprofen (ADVIL,MOTRIN) 800 MG tablet Take 1 tablet (800 mg total) by mouth 3 (three) times daily. 07/16/13  Yes Jillyn LedgerJessica K Palmer, PA-C  albuterol (PROVENTIL HFA;VENTOLIN HFA) 108 (90 BASE) MCG/ACT inhaler Inhale 1-2 puffs into the lungs every 6 (six) hours as needed for wheezing. Patient not taking: Reported on 06/21/2014 10/17/12   Ivonne AndrewPeter Dammen, PA-C  ARIPiprazole (ABILIFY) 5 MG tablet Take 1 tablet (5 mg total) by mouth daily. For mood control Patient not taking: Reported on 06/21/2014 09/13/12   Sanjuana KavaAgnes I Nwoko, NP  doxycycline (VIBRAMYCIN) 100 MG capsule Take 1 capsule (100 mg total) by mouth 2 (two) times daily. Patient not taking: Reported on 06/21/2014 08/06/12  Zadie Rhine, MD  loratadine (CLARITIN) 10 MG tablet Take 1 tablet (10 mg total) by mouth daily. (May be purchased from the over the counter at yr local pharmacy): For allergies Patient not taking: Reported on 06/21/2014 09/13/12   Sanjuana Kava, NP  naproxen (NAPROSYN) 500 MG tablet Take 1 tablet (500 mg total) by mouth 2 (two) times daily. Patient not taking: Reported on 06/21/2014 10/17/12   Ivonne Andrew, PA-C  traZODone (DESYREL) 50 MG  tablet Take 1 tablet (50 mg total) by mouth at bedtime as needed for sleep. For sleep Patient not taking: Reported on 06/21/2014 09/13/12   Sanjuana Kava, NP   BP 129/66 mmHg  Pulse 97  Temp(Src) 97.2 F (36.2 C) (Oral)  Resp 18  Ht 6' (1.829 m)  Wt 158 lb 4.6 oz (71.8 kg)  BMI 21.46 kg/m2  SpO2 98% Physical Exam  Constitutional: He is oriented to person, place, and time. He appears well-developed and well-nourished. No distress.  HENT:  Head: Normocephalic and atraumatic.  Mouth/Throat: Oropharynx is clear and moist.  Eyes: Conjunctivae and EOM are normal. Pupils are equal, round, and reactive to light.  Neck: Normal range of motion. Neck supple.  Cardiovascular: Normal rate, regular rhythm, normal heart sounds and intact distal pulses.   No murmur heard. Pulmonary/Chest: Effort normal and breath sounds normal. No respiratory distress.  Abdominal: Soft. Bowel sounds are normal. There is no tenderness.  Musculoskeletal: He exhibits no edema.  Right antecubital area with about a L5 millimeter area of redness and a small area of inflammation.  Patient's lower back with evidence of the lumbar puncture site.  Neurological: He is alert and oriented to person, place, and time. No cranial nerve deficit. He exhibits normal muscle tone. Coordination normal.  Skin: Skin is warm. No rash noted. There is erythema.  Nursing note and vitals reviewed.   ED Course  Procedures (including critical care time) Labs Review Labs Reviewed  CBC WITH DIFFERENTIAL/PLATELET - Abnormal; Notable for the following:    WBC 21.7 (*)    Platelets 109 (*)    Neutrophils Relative % 78 (*)    Neutro Abs 17.0 (*)    Monocytes Absolute 1.7 (*)    All other components within normal limits  BASIC METABOLIC PANEL - Abnormal; Notable for the following:    Calcium 8.7 (*)    Anion gap 4 (*)    All other components within normal limits  CBC - Abnormal; Notable for the following:    WBC 15.2 (*)    HCT 38.5 (*)     Platelets 134 (*)    All other components within normal limits  BASIC METABOLIC PANEL - Abnormal; Notable for the following:    Glucose, Bld 69 (*)    BUN <5 (*)    Calcium 8.8 (*)    Anion gap 4 (*)    All other components within normal limits  CBC - Abnormal; Notable for the following:    WBC 13.5 (*)    Platelets 124 (*)    All other components within normal limits  GLUCOSE, CAPILLARY - Abnormal; Notable for the following:    Glucose-Capillary 102 (*)    All other components within normal limits  D-DIMER, QUANTITATIVE  TROPONIN I  CREATININE, SERUM  GLUCOSE, CAPILLARY   Results for orders placed or performed during the hospital encounter of 06/21/14  CBC with Differential/Platelet  Result Value Ref Range   WBC 21.7 (H) 4.0 - 10.5 K/uL   RBC 4.36  4.22 - 5.81 MIL/uL   Hemoglobin 13.2 13.0 - 17.0 g/dL   HCT 16.140.3 09.639.0 - 04.552.0 %   MCV 92.4 78.0 - 100.0 fL   MCH 30.3 26.0 - 34.0 pg   MCHC 32.8 30.0 - 36.0 g/dL   RDW 40.914.1 81.111.5 - 91.415.5 %   Platelets 109 (L) 150 - 400 K/uL   Neutrophils Relative % 78 (H) 43 - 77 %   Lymphocytes Relative 13 12 - 46 %   Monocytes Relative 8 3 - 12 %   Eosinophils Relative 1 0 - 5 %   Basophils Relative 0 0 - 1 %   Neutro Abs 17.0 (H) 1.7 - 7.7 K/uL   Lymphs Abs 2.8 0.7 - 4.0 K/uL   Monocytes Absolute 1.7 (H) 0.1 - 1.0 K/uL   Eosinophils Absolute 0.2 0.0 - 0.7 K/uL   Basophils Absolute 0.0 0.0 - 0.1 K/uL   Smear Review MORPHOLOGY UNREMARKABLE   D-dimer, quantitative  Result Value Ref Range   D-Dimer, Quant <0.27 0.00 - 0.48 ug/mL-FEU  Basic metabolic panel  Result Value Ref Range   Sodium 139 135 - 145 mmol/L   Potassium 3.9 3.5 - 5.1 mmol/L   Chloride 108 101 - 111 mmol/L   CO2 27 22 - 32 mmol/L   Glucose, Bld 85 70 - 99 mg/dL   BUN 9 6 - 20 mg/dL   Creatinine, Ser 7.820.83 0.61 - 1.24 mg/dL   Calcium 8.7 (L) 8.9 - 10.3 mg/dL   GFR calc non Af Amer >60 >60 mL/min   GFR calc Af Amer >60 >60 mL/min   Anion gap 4 (L) 5 - 15  Troponin I (q  6hr x 3)  Result Value Ref Range   Troponin I <0.03 <0.031 ng/mL  CBC  Result Value Ref Range   WBC 15.2 (H) 4.0 - 10.5 K/uL   RBC 4.22 4.22 - 5.81 MIL/uL   Hemoglobin 13.0 13.0 - 17.0 g/dL   HCT 95.638.5 (L) 21.339.0 - 08.652.0 %   MCV 91.2 78.0 - 100.0 fL   MCH 30.8 26.0 - 34.0 pg   MCHC 33.8 30.0 - 36.0 g/dL   RDW 57.814.1 46.911.5 - 62.915.5 %   Platelets 134 (L) 150 - 400 K/uL  Creatinine, serum  Result Value Ref Range   Creatinine, Ser 0.94 0.61 - 1.24 mg/dL   GFR calc non Af Amer >60 >60 mL/min   GFR calc Af Amer >60 >60 mL/min  Basic metabolic panel  Result Value Ref Range   Sodium 139 135 - 145 mmol/L   Potassium 4.0 3.5 - 5.1 mmol/L   Chloride 107 101 - 111 mmol/L   CO2 28 22 - 32 mmol/L   Glucose, Bld 69 (L) 70 - 99 mg/dL   BUN <5 (L) 6 - 20 mg/dL   Creatinine, Ser 5.280.86 0.61 - 1.24 mg/dL   Calcium 8.8 (L) 8.9 - 10.3 mg/dL   GFR calc non Af Amer >60 >60 mL/min   GFR calc Af Amer >60 >60 mL/min   Anion gap 4 (L) 5 - 15  CBC  Result Value Ref Range   WBC 13.5 (H) 4.0 - 10.5 K/uL   RBC 4.31 4.22 - 5.81 MIL/uL   Hemoglobin 13.5 13.0 - 17.0 g/dL   HCT 41.339.9 24.439.0 - 01.052.0 %   MCV 92.6 78.0 - 100.0 fL   MCH 31.3 26.0 - 34.0 pg   MCHC 33.8 30.0 - 36.0 g/dL   RDW 27.214.1 53.611.5 - 64.415.5 %  Platelets 124 (L) 150 - 400 K/uL  Glucose, capillary  Result Value Ref Range   Glucose-Capillary 102 (H) 70 - 99 mg/dL  Glucose, capillary  Result Value Ref Range   Glucose-Capillary 86 70 - 99 mg/dL   Dg Chest 2 View  02/22/1094   CLINICAL DATA:  Pain in the arms, some shortness of breath and chest pain, smoking history  EXAM: CHEST  2 VIEW  COMPARISON:  None.  FINDINGS: No active infiltrate or effusion is seen. Mediastinal and hilar contours are unremarkable. The heart is within normal limits in size. No bony abnormality is seen.  IMPRESSION: No active cardiopulmonary disease.   Electronically Signed   By: Dwyane Dee M.D.   On: 06/21/2014 13:21      Imaging Review No results found.   EKG  Interpretation None      MDM   Final diagnoses:  Tachycardia  DVT (deep venous thrombosis), left    Patient very vague about his symptom complaints. Felt like main concern was that they found evidence of a left brachiocephalic DVT or questionable based on CT angios. Doppler studies done here were negative so most likely not any evidence of a deep vein thrombosis in that area. At [facility] patient's d-dimer was markedly elevated at 7 however repeat here is completely normal. Does have a little bit of a leukocytosis but no anemia basic electrolytes without significant abnormalities. Chest x-ray here no acute disease. In review of patient's previous records from here is always had a markedly elevated leukocytosis going all the way back to 2012. Never had a workup of this. Patient does not follow-up outpatient currently.  Original plan was to admit him repeat CT angiogram and perhaps have an evaluation of the leukocytosis. Then we got additional information that may lead to the fact that the patient may have had seizure-like activity. So patient admitted for seizure concerns. Consult with neuro hospitalist and they preferred for him to be admitted to [facility] so patient was transferred there. Patient had already been given Lovenox at [facility]. Patient had a CT of his head done at [facility] that was normal CT angiogram to concern for the right brachiocephalic DVT. And patient had a marked leukocytosis there as well. In reading their notes it did appear the patient may have had a little bit of an altered mental status when he first arrived there.  Patient's initial findings from the lobar puncture without any significant abnormalities the best we can ascertain.    Vanetta Mulders, MD 06/23/14 [phone]

## 2017-03-02 DIAGNOSIS — F320 Major depressive disorder, single episode, mild: Secondary | ICD-10-CM | POA: Insufficient documentation
